# Patient Record
Sex: Female | Born: 1940 | Race: Black or African American | Hispanic: No | Smoking: Never smoker
Health system: Southern US, Community
[De-identification: ages and names within clinical notes are randomized; demographics above are authoritative.]

## PROBLEM LIST (undated history)

## (undated) DIAGNOSIS — I10 Essential (primary) hypertension: Secondary | ICD-10-CM

## (undated) DIAGNOSIS — E7800 Pure hypercholesterolemia, unspecified: Secondary | ICD-10-CM

## (undated) HISTORY — PX: ABDOMINAL HYSTERECTOMY: SHX81

## (undated) HISTORY — PX: OOPHORECTOMY: SHX86

---

## 2004-11-16 ENCOUNTER — Ambulatory Visit: Payer: Self-pay | Admitting: Family Medicine

## 2005-11-19 ENCOUNTER — Ambulatory Visit: Payer: Self-pay | Admitting: Family Medicine

## 2007-05-08 ENCOUNTER — Ambulatory Visit: Payer: Self-pay | Admitting: Family Medicine

## 2008-02-28 ENCOUNTER — Ambulatory Visit: Payer: Self-pay | Admitting: Family Medicine

## 2008-04-20 ENCOUNTER — Ambulatory Visit: Payer: Self-pay | Admitting: Family Medicine

## 2008-05-10 ENCOUNTER — Ambulatory Visit: Payer: Self-pay | Admitting: Family Medicine

## 2009-05-12 ENCOUNTER — Ambulatory Visit: Payer: Self-pay | Admitting: Family Medicine

## 2010-05-25 ENCOUNTER — Ambulatory Visit: Payer: Self-pay | Admitting: Family Medicine

## 2010-10-01 ENCOUNTER — Ambulatory Visit: Payer: Self-pay | Admitting: Internal Medicine

## 2011-06-11 ENCOUNTER — Ambulatory Visit: Payer: Self-pay | Admitting: Family Medicine

## 2012-06-11 ENCOUNTER — Ambulatory Visit: Payer: Self-pay | Admitting: Family Medicine

## 2013-06-18 ENCOUNTER — Ambulatory Visit: Payer: Self-pay | Admitting: Family Medicine

## 2014-06-22 ENCOUNTER — Ambulatory Visit: Payer: Self-pay | Admitting: Family Medicine

## 2014-12-15 DIAGNOSIS — I10 Essential (primary) hypertension: Secondary | ICD-10-CM | POA: Diagnosis not present

## 2014-12-15 DIAGNOSIS — M1990 Unspecified osteoarthritis, unspecified site: Secondary | ICD-10-CM | POA: Diagnosis not present

## 2014-12-15 DIAGNOSIS — E780 Pure hypercholesterolemia: Secondary | ICD-10-CM | POA: Diagnosis not present

## 2014-12-15 DIAGNOSIS — K219 Gastro-esophageal reflux disease without esophagitis: Secondary | ICD-10-CM | POA: Diagnosis not present

## 2015-03-14 ENCOUNTER — Other Ambulatory Visit: Payer: Self-pay

## 2015-03-14 ENCOUNTER — Ambulatory Visit
Admission: EM | Admit: 2015-03-14 | Discharge: 2015-03-14 | Disposition: A | Discharge status: Home / Self Care | Payer: Commercial Managed Care - HMO | Attending: Family Medicine | Admitting: Family Medicine

## 2015-03-14 DIAGNOSIS — I10 Essential (primary) hypertension: Secondary | ICD-10-CM | POA: Diagnosis not present

## 2015-03-14 DIAGNOSIS — G459 Transient cerebral ischemic attack, unspecified: Secondary | ICD-10-CM | POA: Diagnosis not present

## 2015-03-14 DIAGNOSIS — Z823 Family history of stroke: Secondary | ICD-10-CM | POA: Diagnosis not present

## 2015-03-14 DIAGNOSIS — R42 Dizziness and giddiness: Secondary | ICD-10-CM | POA: Diagnosis not present

## 2015-03-14 DIAGNOSIS — E780 Pure hypercholesterolemia: Secondary | ICD-10-CM | POA: Diagnosis not present

## 2015-03-14 DIAGNOSIS — Z7982 Long term (current) use of aspirin: Secondary | ICD-10-CM | POA: Insufficient documentation

## 2015-03-14 DIAGNOSIS — I639 Cerebral infarction, unspecified: Secondary | ICD-10-CM | POA: Diagnosis not present

## 2015-03-14 DIAGNOSIS — Z79899 Other long term (current) drug therapy: Secondary | ICD-10-CM | POA: Insufficient documentation

## 2015-03-14 DIAGNOSIS — R202 Paresthesia of skin: Secondary | ICD-10-CM | POA: Diagnosis not present

## 2015-03-14 DIAGNOSIS — E785 Hyperlipidemia, unspecified: Secondary | ICD-10-CM | POA: Diagnosis not present

## 2015-03-14 DIAGNOSIS — Z5181 Encounter for therapeutic drug level monitoring: Secondary | ICD-10-CM | POA: Diagnosis not present

## 2015-03-14 DIAGNOSIS — K219 Gastro-esophageal reflux disease without esophagitis: Secondary | ICD-10-CM | POA: Diagnosis not present

## 2015-03-14 DIAGNOSIS — R200 Anesthesia of skin: Secondary | ICD-10-CM | POA: Diagnosis not present

## 2015-03-14 HISTORY — DX: Essential (primary) hypertension: I10

## 2015-03-14 HISTORY — DX: Pure hypercholesterolemia, unspecified: E78.00

## 2015-03-14 NOTE — Discharge Instructions (Signed)
As discussed, go directly to the [facility] as you have chosen. This is very important.   Return to Urgent care for new or worsening concerns.   Paresthesia Paresthesia is an abnormal burning or prickling sensation. This sensation is generally felt in the hands, arms, legs, or feet. However, it may occur in any part of the body. It is usually not painful. The feeling may be described as:  Tingling or numbness.  "Pins and needles."  Skin crawling.  Buzzing.  Limbs "falling asleep."  Itching. Most people experience temporary (transient) paresthesia at some time in their lives. CAUSES  Paresthesia may occur when you breathe too quickly (hyperventilation). It can also occur without any apparent cause. Commonly, paresthesia occurs when pressure is placed on a nerve. The feeling quickly goes away once the pressure is removed. For some people, however, paresthesia is a long-lasting (chronic) condition caused by an underlying disorder. The underlying disorder may be:  A traumatic, direct injury to nerves. Examples include a:  Broken (fractured) neck.  Fractured skull.  A disorder affecting the brain and spinal cord (central nervous system). Examples include:  Transverse myelitis.  Encephalitis.  Transient ischemic attack.  Multiple sclerosis.  Stroke.  Tumor or blood vessel problems, such as an arteriovenous malformation pressing against the brain or spinal cord.  A condition that damages the peripheral nerves (peripheral neuropathy). Peripheral nerves are not part of the brain and spinal cord. These conditions include:  Diabetes.  Peripheral vascular disease.  Nerve entrapment syndromes, such as carpal tunnel syndrome.  Shingles.  Hypothyroidism.  Vitamin B12 deficiencies.  Alcoholism.  Heavy metal poisoning (lead, arsenic).  Rheumatoid arthritis.  Systemic lupus erythematosus. DIAGNOSIS  Your caregiver will attempt to find the underlying cause of  your paresthesia. Your caregiver may:  Take your medical history.  Perform a physical exam.  Order various lab tests.  Order imaging tests. TREATMENT  Treatment for paresthesia depends on the underlying cause. HOME CARE INSTRUCTIONS  Avoid drinking alcohol.  You may consider massage or acupuncture to help relieve your symptoms.  Keep all follow-up appointments as directed by your caregiver. SEEK IMMEDIATE MEDICAL CARE IF:   You feel weak.  You have trouble walking or moving.  You have problems with speech or vision.  You feel confused.  You cannot control your bladder or bowel movements.  You feel numbness after an injury.  You faint.  Your burning or prickling feeling gets worse when walking.  You have pain, cramps, or dizziness.  You develop a rash. MAKE SURE YOU:  Understand these instructions.  Will watch your condition.  Will get help right away if you are not doing well or get worse. Document Released: 06/15/2002 Document Revised: 09/17/2011 Document Reviewed: 03/16/2011 [facility] Patient Information 2015 [place], [place]. This information is not intended to replace advice given to you by your health care provider. Make sure you discuss any questions you have with your health care provider.

## 2015-03-14 NOTE — ED Notes (Signed)
States started yesterday with right sided "body tingling". Had right leg tingling 1 week ago. Denies chest pain.. Grips strong and equal. No facial drooping

## 2015-03-14 NOTE — ED Provider Notes (Signed)
[facility] Emergency Department Provider Note  ____________________________________________  Time seen: Approximately 11:25AM  I have reviewed the triage vital signs and the nursing notes.   HISTORY  Chief Complaint Numbness   HPI Ashlyn Cabler is a 74 y.o. female presents to urgent care for complaints of right face and right arm tingling sensation described as numbness sensation. Reports onset yesterday am at approximately 0700, states at that time she had tingling and numbness sensation to right face, right arm and right leg which stayed present for majority of the day but did slightly improve. States currently with right arm and right face tingling sensation. States right arm also feels heavy and weak. States symptoms have been unchanged since awakening this am.   Denies pain. Denies dizziness, headache, vision change or loss, fall, head injury or LOC. Denies chest pain, shortness of breath, abdominal pain, fever, neck or back pain. Denies recent sickness. Denies symptom radiation.   Denies pain at this time. States right arm and face does "not feel normal". Denies history of CVA or TIA. Reports mother had x 2 CVAs. Reports took daily medications last night including 81 mg aspirin, atorvastatin and hctz. Reports drove self to urgent care today.   Denies previous sensation of same or similar. States has arthritis and frequently has leg or shoulder pain but "this is different".     Past Medical History  Diagnosis Date  . Hypercholesteremia   . Hypertension     There are no active problems to display for this patient.   Past Surgical History  Procedure Laterality Date  . Abdominal hysterectomy      Current Outpatient Rx  Name  Route  Sig  Dispense  Refill  . aspirin EC 81 MG tablet   Oral   Take 81 mg by mouth daily.         Marland Kitchen atorvastatin (LIPITOR) 10 MG tablet   Oral   Take 10 mg by mouth daily.         . calcium-vitamin D (OSCAL WITH D)  500-200 MG-UNIT per tablet   Oral   Take 1 tablet by mouth.         . hydrochlorothiazide (HYDRODIURIL) 25 MG tablet   Oral   Take 25 mg by mouth daily.         . potassium chloride (KLOR-CON) 20 MEQ packet   Oral   Take by mouth 2 (two) times daily.           Allergies Review of patient's allergies indicates no known allergies.  Family History  Problem Relation Age of Onset  . Heart attack Mother   . Cancer Mother   Mother CVA x 2   Social History Social History  Substance Use Topics  . Smoking status: Never Smoker   . Smokeless tobacco: None  . Alcohol Use: No    Review of Systems Constitutional: No fever/chills Eyes: No visual changes. ENT: No sore throat. Cardiovascular: Denies chest pain. Respiratory: Denies shortness of breath. Gastrointestinal: No abdominal pain.  No nausea, no vomiting.  No diarrhea.  No constipation. Genitourinary: Negative for dysuria. Musculoskeletal: Negative for back pain. Skin: Negative for rash. Neurological: Negative for headaches.. Right arm tingling and heaviness and right face tingling and numbness  as above.  10-point ROS otherwise negative.  ____________________________________________   PHYSICAL EXAM:  VITAL SIGNS: ED Triage Vitals  Enc Vitals Group     BP 03/14/15 1047 180/82 mmHg     Pulse Rate 03/14/15 1047 88  Resp 03/14/15 1047 17     Temp 03/14/15 1047 97.5 F (36.4 C)     Temp Source 03/14/15 1047 Tympanic     SpO2 03/14/15 1047 98 %     Weight 03/14/15 1047 155 lb (70.308 kg)     Height 03/14/15 1047 5\' 2"  (1.575 m)     Head Cir --      Peak Flow --      Pain Score --      Pain Loc --      Pain Edu? --      Excl. in [place]? --    Today's Vitals   03/14/15 1047 03/14/15 1154  BP: 180/82 184/86  Pulse: 88 92  Temp: 97.5 F (36.4 C)   TempSrc: Tympanic   Resp: 17 17  Height: 5\' 2"  (1.575 m)   Weight: 155 lb (70.308 kg)   SpO2: 98% 99%    Constitutional: Alert and oriented. Well appearing  and in no acute distress. Eyes: Conjunctivae are normal. PERRL. EOMI. No vision loss. No hemianopsia.  Head: Atraumatic.No eccymosis, or swelling. Skin intact.   Ears: no erythema, normal TMs bilaterally.   Nose: No congestion/rhinnorhea.  Mouth/Throat: Mucous membranes are moist.  Oropharynx non-erythematous. Neck: No stridor.  No cervical spine tenderness to palpation. Hematological/Lymphatic/Immunilogical: No cervical lymphadenopathy. Cardiovascular: Normal rate, regular rhythm. Grossly normal heart sounds.  Good peripheral circulation. Respiratory: Normal respiratory effort.  No retractions. Lungs CTAB. Gastrointestinal: Soft and nontender. No distention. Normal Bowel sounds.  No abdominal bruits. No CVA tenderness. Musculoskeletal: No lower or upper extremity tenderness nor edema.  No joint effusions. Bilateral pedal pulses equal and easily palpated. No cervical, thoracic or lumbar TTP.  Neurologic:  Normal speech and language. No gross focal neurologic deficits are appreciated. No gait instability.CN 2-12 intact. No ataxia with finger to nose. Steady gait. Normal discrimination between sharp and dull bilaterally to face and bilateral upper and lower extremities. 5/5 strength to bilateral upper and lower extremities. Negative romberg.  Skin:  Skin is warm, dry and intact. No rash noted. Bilateral hand grips equal.  Psychiatric: Mood and affect are normal. Speech and behavior are normal.  ____________________________________________   LABS (all labs ordered are listed, but only abnormal results are displayed)  Labs Reviewed - No data to display ____________________________________________  EKG  Interpreted and reviewed by Dr Gregary Signs, see ekg.   71 bpm.  Dr Alveta Heimlich agrees EKG showing normal sinus rhythm with sinus arrhythmia. No STEMI. ____________________________________________  INITIAL IMPRESSION / ASSESSMENT AND PLAN / ED COURSE  Pertinent labs & imaging results that were  available during my care of the patient were reviewed by me and considered in my medical decision making (see chart for details).  Alert and oriented. Drove self to urgent care for complaint of right face and right arm tingling sensation persistent since 0700 yesterday (Sunday am). Reports present upon awakening yesterday. Reports yesterday also with right leg tingling sensation that has since resolved. No neurological deficits on exam, however with continued subjective report of right face and right arm tingling/numbness with report of right arm heaviness and weakness today.  Steady gait. Denies chest pain, shortness of breath or abdominal pain.   Discussed in detail with Dr Alveta Heimlich at bedside who also reviewed EKG. Patient alert and oriented with decisional capacity. Suspect reported paresthesia second to TIA. Discussed need for further evaluation and possible further monitoring including Ct, MRI and possible neurology evaluation.  Discussed need to proceed to ER as this facility does  not have these means. Patient [facility] to [facility]. EMS paged by RN.   Patient family now at bedside. [facility] now at bedside. [facility] reports unable to go outside the county to transport patient at this time. Patient alert and oriented with decisional capacity and refuses to go by EMS, and reports will be transported by Son at bedside to [facility] ER. Son at bedside, Sherren Mocha, reports will drive patient directly to ER. Patient and family verbalized understanding and agreed to plan.  Discussed risks and benefits of private vehicle transfer including death, and patient and son verbalized understanding, and choose to transfer self to ER. Private vehicle transfer to [facility] ER for right face and right arm paresthesia, concern for TIA. [facility] transfer center ER called and report given.     ____________________________________________   FINAL CLINICAL IMPRESSION(S) / ED DIAGNOSES  Final diagnoses:   Tingling of right upper extremity and right side of face       Marylene Land, NP 03/14/15 1401

## 2015-03-15 DIAGNOSIS — E785 Hyperlipidemia, unspecified: Secondary | ICD-10-CM | POA: Diagnosis not present

## 2015-03-15 DIAGNOSIS — R202 Paresthesia of skin: Secondary | ICD-10-CM | POA: Diagnosis not present

## 2015-03-15 DIAGNOSIS — Z823 Family history of stroke: Secondary | ICD-10-CM | POA: Diagnosis not present

## 2015-03-15 DIAGNOSIS — R42 Dizziness and giddiness: Secondary | ICD-10-CM | POA: Diagnosis not present

## 2015-03-15 DIAGNOSIS — G459 Transient cerebral ischemic attack, unspecified: Secondary | ICD-10-CM | POA: Diagnosis not present

## 2015-03-15 DIAGNOSIS — Z79899 Other long term (current) drug therapy: Secondary | ICD-10-CM | POA: Diagnosis not present

## 2015-03-15 DIAGNOSIS — Z7982 Long term (current) use of aspirin: Secondary | ICD-10-CM | POA: Diagnosis not present

## 2015-03-15 DIAGNOSIS — Z5181 Encounter for therapeutic drug level monitoring: Secondary | ICD-10-CM | POA: Diagnosis not present

## 2015-03-15 DIAGNOSIS — K219 Gastro-esophageal reflux disease without esophagitis: Secondary | ICD-10-CM | POA: Diagnosis not present

## 2015-03-15 DIAGNOSIS — I10 Essential (primary) hypertension: Secondary | ICD-10-CM | POA: Diagnosis not present

## 2015-03-21 DIAGNOSIS — R202 Paresthesia of skin: Secondary | ICD-10-CM | POA: Diagnosis not present

## 2015-04-13 DIAGNOSIS — G459 Transient cerebral ischemic attack, unspecified: Secondary | ICD-10-CM | POA: Diagnosis not present

## 2015-05-03 DIAGNOSIS — H40003 Preglaucoma, unspecified, bilateral: Secondary | ICD-10-CM | POA: Diagnosis not present

## 2015-05-09 DIAGNOSIS — R200 Anesthesia of skin: Secondary | ICD-10-CM | POA: Diagnosis not present

## 2015-05-19 DIAGNOSIS — G40109 Localization-related (focal) (partial) symptomatic epilepsy and epileptic syndromes with simple partial seizures, not intractable, without status epilepticus: Secondary | ICD-10-CM | POA: Diagnosis not present

## 2015-05-23 DIAGNOSIS — R200 Anesthesia of skin: Secondary | ICD-10-CM | POA: Diagnosis not present

## 2015-05-30 DIAGNOSIS — H40003 Preglaucoma, unspecified, bilateral: Secondary | ICD-10-CM | POA: Diagnosis not present

## 2015-06-20 DIAGNOSIS — G5602 Carpal tunnel syndrome, left upper limb: Secondary | ICD-10-CM | POA: Diagnosis not present

## 2015-06-21 DIAGNOSIS — E7800 Pure hypercholesterolemia, unspecified: Secondary | ICD-10-CM | POA: Diagnosis not present

## 2015-06-21 DIAGNOSIS — J329 Chronic sinusitis, unspecified: Secondary | ICD-10-CM | POA: Diagnosis not present

## 2015-06-21 DIAGNOSIS — B9789 Other viral agents as the cause of diseases classified elsewhere: Secondary | ICD-10-CM | POA: Diagnosis not present

## 2015-06-21 DIAGNOSIS — I10 Essential (primary) hypertension: Secondary | ICD-10-CM | POA: Diagnosis not present

## 2015-08-24 DIAGNOSIS — R05 Cough: Secondary | ICD-10-CM | POA: Diagnosis not present

## 2015-12-27 DIAGNOSIS — Z0000 Encounter for general adult medical examination without abnormal findings: Secondary | ICD-10-CM | POA: Diagnosis not present

## 2015-12-27 DIAGNOSIS — Z1231 Encounter for screening mammogram for malignant neoplasm of breast: Secondary | ICD-10-CM | POA: Diagnosis not present

## 2016-01-12 DIAGNOSIS — R200 Anesthesia of skin: Secondary | ICD-10-CM | POA: Diagnosis not present

## 2016-01-26 DIAGNOSIS — R202 Paresthesia of skin: Secondary | ICD-10-CM | POA: Diagnosis not present

## 2016-01-26 DIAGNOSIS — G252 Other specified forms of tremor: Secondary | ICD-10-CM | POA: Diagnosis not present

## 2016-01-26 DIAGNOSIS — R200 Anesthesia of skin: Secondary | ICD-10-CM | POA: Diagnosis not present

## 2016-02-01 ENCOUNTER — Other Ambulatory Visit: Payer: Self-pay | Admitting: Neurology

## 2016-02-01 DIAGNOSIS — R202 Paresthesia of skin: Principal | ICD-10-CM

## 2016-02-01 DIAGNOSIS — R200 Anesthesia of skin: Secondary | ICD-10-CM

## 2016-02-01 DIAGNOSIS — R251 Tremor, unspecified: Secondary | ICD-10-CM

## 2016-02-21 ENCOUNTER — Ambulatory Visit
Admission: RE | Admit: 2016-02-21 | Discharge: 2016-02-21 | Disposition: A | Discharge status: Home / Self Care | Payer: Commercial Managed Care - HMO | Source: Ambulatory Visit | Attending: Neurology | Admitting: Neurology

## 2016-02-21 DIAGNOSIS — M4802 Spinal stenosis, cervical region: Secondary | ICD-10-CM | POA: Insufficient documentation

## 2016-02-21 DIAGNOSIS — R251 Tremor, unspecified: Secondary | ICD-10-CM

## 2016-02-21 DIAGNOSIS — R202 Paresthesia of skin: Principal | ICD-10-CM

## 2016-02-21 DIAGNOSIS — M50221 Other cervical disc displacement at C4-C5 level: Secondary | ICD-10-CM | POA: Diagnosis not present

## 2016-02-21 DIAGNOSIS — G252 Other specified forms of tremor: Secondary | ICD-10-CM | POA: Diagnosis not present

## 2016-02-21 DIAGNOSIS — R200 Anesthesia of skin: Secondary | ICD-10-CM | POA: Diagnosis not present

## 2016-02-23 DIAGNOSIS — R911 Solitary pulmonary nodule: Secondary | ICD-10-CM | POA: Diagnosis not present

## 2016-02-23 DIAGNOSIS — R918 Other nonspecific abnormal finding of lung field: Secondary | ICD-10-CM | POA: Diagnosis not present

## 2016-06-27 DIAGNOSIS — K219 Gastro-esophageal reflux disease without esophagitis: Secondary | ICD-10-CM | POA: Diagnosis not present

## 2016-06-27 DIAGNOSIS — I10 Essential (primary) hypertension: Secondary | ICD-10-CM | POA: Diagnosis not present

## 2016-06-27 DIAGNOSIS — E7800 Pure hypercholesterolemia, unspecified: Secondary | ICD-10-CM | POA: Diagnosis not present

## 2016-06-27 DIAGNOSIS — R911 Solitary pulmonary nodule: Secondary | ICD-10-CM | POA: Diagnosis not present

## 2017-01-10 DIAGNOSIS — Z79899 Other long term (current) drug therapy: Secondary | ICD-10-CM | POA: Diagnosis not present

## 2017-01-10 DIAGNOSIS — M170 Bilateral primary osteoarthritis of knee: Secondary | ICD-10-CM | POA: Diagnosis not present

## 2017-01-10 DIAGNOSIS — E559 Vitamin D deficiency, unspecified: Secondary | ICD-10-CM | POA: Diagnosis not present

## 2017-01-10 DIAGNOSIS — I10 Essential (primary) hypertension: Secondary | ICD-10-CM | POA: Diagnosis not present

## 2017-01-10 DIAGNOSIS — R7302 Impaired glucose tolerance (oral): Secondary | ICD-10-CM | POA: Diagnosis not present

## 2017-01-10 DIAGNOSIS — E7800 Pure hypercholesterolemia, unspecified: Secondary | ICD-10-CM | POA: Diagnosis not present

## 2017-06-05 ENCOUNTER — Encounter: Payer: Self-pay | Admitting: Emergency Medicine

## 2017-06-05 ENCOUNTER — Ambulatory Visit
Admission: EM | Admit: 2017-06-05 | Discharge: 2017-06-05 | Disposition: A | Discharge status: Home / Self Care | Payer: Commercial Managed Care - HMO | Attending: Family Medicine | Admitting: Family Medicine

## 2017-06-05 ENCOUNTER — Other Ambulatory Visit: Payer: Self-pay

## 2017-06-05 DIAGNOSIS — Z79899 Other long term (current) drug therapy: Secondary | ICD-10-CM | POA: Diagnosis not present

## 2017-06-05 DIAGNOSIS — Z7982 Long term (current) use of aspirin: Secondary | ICD-10-CM | POA: Diagnosis not present

## 2017-06-05 DIAGNOSIS — M940 Chondrocostal junction syndrome [Tietze]: Secondary | ICD-10-CM | POA: Insufficient documentation

## 2017-06-05 DIAGNOSIS — R0789 Other chest pain: Secondary | ICD-10-CM

## 2017-06-05 DIAGNOSIS — R079 Chest pain, unspecified: Secondary | ICD-10-CM | POA: Insufficient documentation

## 2017-06-05 NOTE — ED Provider Notes (Signed)
[facility]    CSN: [id number] Arrival date & time: 06/05/17  1006     History   Chief Complaint Chief Complaint  Patient presents with  . Chest Pain    HPI Tanya Campbell is a 76 y.o. female.   76 yo female with a c/o intermittent chest pain episodes for the past month. States pain is "sharp, like a knife" and lasts only 2-3 seconds. Episodes not associated with any pattern of activity. Denies any neck pain, jaw pain, diaphoresis, fevers, chills, shortness of breath, wheezing.    The history is provided by the patient.  Chest Pain    Past Medical History:  Diagnosis Date  . Hypercholesteremia   . Hypertension     There are no active problems to display for this patient.   Past Surgical History:  Procedure Laterality Date  . ABDOMINAL HYSTERECTOMY      OB History    No data available       Home Medications    Prior to Admission medications   Medication Sig Start Date End Date Taking? Authorizing Provider  aspirin EC 81 MG tablet Take 81 mg by mouth daily.   Yes [provider]  atorvastatin (LIPITOR) 10 MG tablet Take 10 mg by mouth daily.   Yes [provider]  calcium-vitamin D (OSCAL WITH D) 500-200 MG-UNIT per tablet Take 1 tablet by mouth.   Yes [provider]  hydrochlorothiazide (HYDRODIURIL) 25 MG tablet Take 25 mg by mouth daily.   Yes [provider]  potassium chloride (KLOR-CON) 20 MEQ packet Take by mouth 2 (two) times daily.   Yes [provider]    Family History Family History  Problem Relation Age of Onset  . Heart attack Mother   . Cancer Mother     Social History Social History   Tobacco Use  . Smoking status: Never Smoker  . Smokeless tobacco: Never Used  Substance Use Topics  . Alcohol use: No  . Drug use: Not on file     Allergies   Patient has no known allergies.   Review of Systems Review of Systems  Cardiovascular: Positive for chest pain.      Physical Exam Triage Vital Signs ED Triage Vitals  Enc Vitals Group     BP 06/05/17 1014 (!) 153/92     Pulse Rate 06/05/17 1014 93     Resp 06/05/17 1014 14     Temp 06/05/17 1014 98.6 F (37 C)     Temp Source 06/05/17 1014 Oral     SpO2 06/05/17 1014 100 %     Weight 06/05/17 1012 150 lb (68 kg)     Height 06/05/17 1012 5\' 2"  (1.575 m)     Head Circumference --      Peak Flow --      Pain Score 06/05/17 1013 0     Pain Loc --      Pain Edu? --      Excl. in [place]? --    No data found.  Updated Vital Signs BP (!) 153/92 (BP Location: Left Arm)   Pulse 93   Temp 98.6 F (37 C) (Oral)   Resp 14   Ht 5\' 2"  (1.575 m)   Wt 150 lb (68 kg)   SpO2 100%   BMI 27.44 kg/m   Visual Acuity Right Eye Distance:   Left Eye Distance:   Bilateral Distance:    Right Eye Near:   Left Eye Near:  Bilateral Near:     Physical Exam  Constitutional: She appears well-developed and well-nourished. No distress.  HENT:  Head: Normocephalic and atraumatic.  Nose: No mucosal edema, rhinorrhea, nose lacerations, sinus tenderness, nasal deformity, septal deviation or nasal septal hematoma. No epistaxis.  No foreign bodies. Right sinus exhibits no maxillary sinus tenderness and no frontal sinus tenderness. Left sinus exhibits no maxillary sinus tenderness and no frontal sinus tenderness.  Mouth/Throat: Uvula is midline, oropharynx is clear and moist and mucous membranes are normal. No oropharyngeal exudate.  Eyes: Conjunctivae and EOM are normal. Pupils are equal, round, and reactive to light. Right eye exhibits no discharge. Left eye exhibits no discharge. No scleral icterus.  Neck: Normal range of motion. Neck supple. No thyromegaly present.  Cardiovascular: Normal rate, regular rhythm and normal heart sounds.  Pulmonary/Chest: Effort normal and breath sounds normal. No stridor. No respiratory distress. She has no wheezes. She has no rales. She exhibits tenderness.  Musculoskeletal: She  exhibits no edema.  Lymphadenopathy:    She has no cervical adenopathy.  Skin: No rash noted. She is not diaphoretic.  Nursing note and vitals reviewed.    UC Treatments / Results  Labs (all labs ordered are listed, but only abnormal results are displayed) Labs Reviewed - No data to display  EKG  EKG Interpretation None       Radiology No results found.  Procedures ED EKG Date/Time: 06/05/2017 8:00 PM Performed by: Norval Gable, MD Authorized by: Norval Gable, MD   ECG reviewed by ED Physician in the absence of a cardiologist: yes   Previous ECG:    Previous ECG:  Unavailable Interpretation:    Interpretation: normal   Rate:    ECG rate assessment: normal   Rhythm:    Rhythm: sinus rhythm   Ectopy:    Ectopy: none   QRS:    QRS axis:  Normal Conduction:    Conduction: normal   ST segments:    ST segments:  Normal T waves:    T waves: normal      (including critical care time)  Medications Ordered in UC Medications - No data to display   Initial Impression / Assessment and Plan / UC Course  I have reviewed the triage vital signs and the nursing notes.  Pertinent labs & imaging results that were available during my care of the patient were reviewed by me and considered in my medical decision making (see chart for details).       Final Clinical Impressions(s) / UC Diagnoses   Final diagnoses:  Chest wall pain  Costochondritis    ED Discharge Orders    None      1. ekg results and diagnosis reviewed with patient 2. rx as per orders above; reviewed possible side effects, interactions, risks and benefits  3. Recommend supportive treatment with  4. Follow-up prn if symptoms worsen or don't improve    Controlled Substance Prescriptions [place] Controlled Substance Registry consulted? Not Applicable   Norval Gable, MD 06/05/17 2004

## 2017-06-05 NOTE — ED Triage Notes (Signed)
Patient c/o chest pain off and on for a month. Patient denies SOB.

## 2017-07-16 ENCOUNTER — Other Ambulatory Visit: Payer: Self-pay | Admitting: Internal Medicine

## 2017-07-16 DIAGNOSIS — Z1231 Encounter for screening mammogram for malignant neoplasm of breast: Secondary | ICD-10-CM | POA: Diagnosis not present

## 2017-07-16 DIAGNOSIS — R7302 Impaired glucose tolerance (oral): Secondary | ICD-10-CM | POA: Diagnosis not present

## 2017-07-16 DIAGNOSIS — K219 Gastro-esophageal reflux disease without esophagitis: Secondary | ICD-10-CM | POA: Diagnosis not present

## 2017-07-16 DIAGNOSIS — I10 Essential (primary) hypertension: Secondary | ICD-10-CM | POA: Diagnosis not present

## 2017-07-16 DIAGNOSIS — Z1239 Encounter for other screening for malignant neoplasm of breast: Secondary | ICD-10-CM

## 2017-07-16 DIAGNOSIS — E7800 Pure hypercholesterolemia, unspecified: Secondary | ICD-10-CM | POA: Diagnosis not present

## 2017-07-16 DIAGNOSIS — Z79899 Other long term (current) drug therapy: Secondary | ICD-10-CM | POA: Diagnosis not present

## 2017-07-16 DIAGNOSIS — Z0000 Encounter for general adult medical examination without abnormal findings: Secondary | ICD-10-CM | POA: Diagnosis not present

## 2017-07-30 ENCOUNTER — Ambulatory Visit
Admission: RE | Admit: 2017-07-30 | Discharge: 2017-07-30 | Disposition: A | Discharge status: Home / Self Care | Payer: Medicare HMO | Source: Ambulatory Visit | Attending: Internal Medicine | Admitting: Internal Medicine

## 2017-07-30 DIAGNOSIS — Z1231 Encounter for screening mammogram for malignant neoplasm of breast: Secondary | ICD-10-CM | POA: Diagnosis not present

## 2017-07-30 DIAGNOSIS — Z1239 Encounter for other screening for malignant neoplasm of breast: Secondary | ICD-10-CM

## 2017-07-30 DIAGNOSIS — R928 Other abnormal and inconclusive findings on diagnostic imaging of breast: Secondary | ICD-10-CM | POA: Diagnosis not present

## 2017-08-02 ENCOUNTER — Other Ambulatory Visit: Payer: Self-pay | Admitting: Internal Medicine

## 2017-08-02 DIAGNOSIS — R928 Other abnormal and inconclusive findings on diagnostic imaging of breast: Secondary | ICD-10-CM

## 2017-08-02 DIAGNOSIS — R921 Mammographic calcification found on diagnostic imaging of breast: Secondary | ICD-10-CM

## 2017-08-08 ENCOUNTER — Other Ambulatory Visit: Payer: Medicare HMO

## 2017-08-08 ENCOUNTER — Ambulatory Visit: Payer: Medicare HMO

## 2017-08-14 ENCOUNTER — Ambulatory Visit
Admission: RE | Admit: 2017-08-14 | Discharge: 2017-08-14 | Disposition: A | Discharge status: Home / Self Care | Payer: Medicare HMO | Source: Ambulatory Visit | Attending: Internal Medicine | Admitting: Internal Medicine

## 2017-08-14 DIAGNOSIS — R928 Other abnormal and inconclusive findings on diagnostic imaging of breast: Secondary | ICD-10-CM

## 2017-08-14 DIAGNOSIS — R921 Mammographic calcification found on diagnostic imaging of breast: Secondary | ICD-10-CM

## 2017-08-20 ENCOUNTER — Other Ambulatory Visit: Payer: Self-pay | Admitting: Internal Medicine

## 2017-08-20 DIAGNOSIS — R921 Mammographic calcification found on diagnostic imaging of breast: Secondary | ICD-10-CM

## 2017-08-20 DIAGNOSIS — R928 Other abnormal and inconclusive findings on diagnostic imaging of breast: Secondary | ICD-10-CM

## 2017-08-27 ENCOUNTER — Ambulatory Visit
Admission: RE | Admit: 2017-08-27 | Discharge: 2017-08-27 | Disposition: A | Discharge status: Home / Self Care | Payer: Medicare HMO | Source: Ambulatory Visit | Attending: Internal Medicine | Admitting: Internal Medicine

## 2017-08-27 DIAGNOSIS — D241 Benign neoplasm of right breast: Secondary | ICD-10-CM | POA: Insufficient documentation

## 2017-08-27 DIAGNOSIS — N6011 Diffuse cystic mastopathy of right breast: Secondary | ICD-10-CM | POA: Diagnosis not present

## 2017-08-27 DIAGNOSIS — R928 Other abnormal and inconclusive findings on diagnostic imaging of breast: Secondary | ICD-10-CM

## 2017-08-27 DIAGNOSIS — R921 Mammographic calcification found on diagnostic imaging of breast: Secondary | ICD-10-CM

## 2017-08-27 DIAGNOSIS — R920 Mammographic microcalcification found on diagnostic imaging of breast: Secondary | ICD-10-CM | POA: Diagnosis not present

## 2017-08-27 HISTORY — PX: BREAST BIOPSY: SHX20

## 2017-08-28 LAB — SURGICAL PATHOLOGY

## 2018-01-14 DIAGNOSIS — E559 Vitamin D deficiency, unspecified: Secondary | ICD-10-CM | POA: Diagnosis not present

## 2018-01-14 DIAGNOSIS — N183 Chronic kidney disease, stage 3 (moderate): Secondary | ICD-10-CM | POA: Diagnosis not present

## 2018-01-14 DIAGNOSIS — I10 Essential (primary) hypertension: Secondary | ICD-10-CM | POA: Diagnosis not present

## 2018-01-14 DIAGNOSIS — K219 Gastro-esophageal reflux disease without esophagitis: Secondary | ICD-10-CM | POA: Diagnosis not present

## 2018-01-14 DIAGNOSIS — Z79899 Other long term (current) drug therapy: Secondary | ICD-10-CM | POA: Diagnosis not present

## 2018-01-14 DIAGNOSIS — R921 Mammographic calcification found on diagnostic imaging of breast: Secondary | ICD-10-CM | POA: Diagnosis not present

## 2018-01-14 DIAGNOSIS — E7800 Pure hypercholesterolemia, unspecified: Secondary | ICD-10-CM | POA: Diagnosis not present

## 2018-01-14 DIAGNOSIS — R7302 Impaired glucose tolerance (oral): Secondary | ICD-10-CM | POA: Diagnosis not present

## 2018-01-23 ENCOUNTER — Other Ambulatory Visit: Payer: Self-pay | Admitting: Internal Medicine

## 2018-01-23 DIAGNOSIS — R921 Mammographic calcification found on diagnostic imaging of breast: Secondary | ICD-10-CM

## 2018-02-25 ENCOUNTER — Ambulatory Visit
Admission: RE | Admit: 2018-02-25 | Discharge: 2018-02-25 | Disposition: A | Discharge status: Home / Self Care | Payer: Medicare HMO | Source: Ambulatory Visit | Attending: Internal Medicine | Admitting: Internal Medicine

## 2018-02-25 DIAGNOSIS — R921 Mammographic calcification found on diagnostic imaging of breast: Secondary | ICD-10-CM | POA: Insufficient documentation

## 2018-02-25 DIAGNOSIS — R920 Mammographic microcalcification found on diagnostic imaging of breast: Secondary | ICD-10-CM | POA: Diagnosis not present

## 2018-04-18 DIAGNOSIS — Z23 Encounter for immunization: Secondary | ICD-10-CM | POA: Diagnosis not present

## 2018-05-18 ENCOUNTER — Ambulatory Visit
Admission: EM | Admit: 2018-05-18 | Discharge: 2018-05-18 | Disposition: A | Discharge status: Home / Self Care | Payer: Medicare HMO | Attending: Family Medicine | Admitting: Family Medicine

## 2018-05-18 ENCOUNTER — Encounter: Payer: Self-pay | Admitting: Gynecology

## 2018-05-18 ENCOUNTER — Other Ambulatory Visit: Payer: Self-pay

## 2018-05-18 DIAGNOSIS — M79604 Pain in right leg: Secondary | ICD-10-CM | POA: Diagnosis not present

## 2018-05-18 NOTE — ED Triage Notes (Signed)
Per patient x 5 days right leg pain/ discomfort.

## 2018-05-18 NOTE — Discharge Instructions (Signed)
If your symptoms continue or worsen follow-up with your primary care physician

## 2018-05-18 NOTE — ED Provider Notes (Signed)
[facility]    CSN: [id number] Arrival date & time: 05/18/18  9833     History   Chief Complaint Chief Complaint  Patient presents with  . Leg Pain    HPI Tanya Campbell is a 77 y.o. female.   HPI. 77 year old female presents with 5 days history of right leg pain and discomfort that is intermittent and lasts for very short duration of time.  Pain is mainly from her knee down into her ankle level.  Feels as if there was a cramping sensation.  It is not associated with any activity.  He does walk 1 to 2 miles daily and has not injured herself.  She has had no increase in activity change in shoewear.  She has no ankle pain.  He feels tiny knots on her leg at times.  No symptoms of spinal or vascular claudication.  Denies any back pain felt only on the right leg.  Does not affect the left leg at all.  States that she stays well-hydrated.  Prior to this 5 days states that off and on she has had this problem for over a year.  Last group of symptoms were approximately 1 month ago and then subsided.  When she has the pain there does not appear to be any residual the next episode happens.  Last 5 days she has had may be several times a day every other day that the pain is present.         Past Medical History:  Diagnosis Date  . Hypercholesteremia   . Hypertension     There are no active problems to display for this patient.   Past Surgical History:  Procedure Laterality Date  . ABDOMINAL HYSTERECTOMY    . 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 BREAST BIOPSY Right 08/27/2017   FOCAL FIBROCYSTIC CHANGE    OB History   None      Home Medications    Prior to Admission medications   Medication Sig Start Date End Date Taking? Authorizing Provider  aspirin EC 81 MG tablet Take 81 mg by mouth daily.   Yes [provider]  atorvastatin (LIPITOR) 10 MG tablet Take 10 mg by mouth daily.   Yes [provider]  calcium-vitamin D  (OSCAL WITH D) 500-200 MG-UNIT per tablet Take 1 tablet by mouth.   Yes [provider]  hydrochlorothiazide (HYDRODIURIL) 25 MG tablet Take 25 mg by mouth daily.   Yes [provider]  potassium chloride (KLOR-CON) 20 MEQ packet Take by mouth 2 (two) times daily.   Yes [provider]    Family History Family History  Problem Relation Age of Onset  . Heart attack Mother   . Cancer Mother   . Breast cancer Neg Hx     Social History Social History   Tobacco Use  . Smoking status: Never Smoker  . Smokeless tobacco: Never Used  Substance Use Topics  . Alcohol use: No  . Drug use: Not on file     Allergies   Patient has no known allergies.   Review of Systems Review of Systems  Constitutional: Negative for activity change, appetite change, chills, fatigue and fever.  Musculoskeletal: Positive for myalgias.  All other systems reviewed and are negative.    Physical Exam Triage Vital Signs ED Triage Vitals  Enc Vitals Group     BP 05/18/18 0852 133/86     Pulse Rate 05/18/18 0852 80  Resp 05/18/18 0852 16     Temp 05/18/18 0852 97.9 F (36.6 C)     Temp Source 05/18/18 0852 Oral     SpO2 05/18/18 0852 100 %     Weight 05/18/18 0854 140 lb (63.5 kg)     Height 05/18/18 0854 5\' 2"  (1.575 m)     Head Circumference --      Peak Flow --      Pain Score 05/18/18 0854 5     Pain Loc --      Pain Edu? --      Excl. in [place]? --    No data found.  Updated Vital Signs BP 133/86 (BP Location: Left Arm)   Pulse 80   Temp 97.9 F (36.6 C) (Oral)   Resp 16   Ht 5\' 2"  (1.575 m)   Wt 140 lb (63.5 kg)   SpO2 100%   BMI 25.61 kg/m   Visual Acuity Right Eye Distance:   Left Eye Distance:   Bilateral Distance:    Right Eye Near:   Left Eye Near:    Bilateral Near:     Physical Exam  Constitutional: She is oriented to person, place, and time. She appears well-developed and well-nourished. No distress.  HENT:  Head: Normocephalic.    Eyes: Pupils are equal, round, and reactive to light.  Neck: Normal range of motion.  Musculoskeletal: Normal range of motion. She exhibits no edema, tenderness or deformity.  Examination of the right lower extremity shows no edema no ecchymosis no erythema no crepitus.  Puklses of the dorsalis pedis and posterior tibialis are strong full and intact.  There is no hip esthesia to light touch throughout.  EHL peroneal and anterior tibialis are strong to clinical testing.  Full range of motion of her ankle knee and toes.  No swelling or warmth of the right lower extremity.  He is also shows full range of motion.  There is no ligamentous laxity present.  There is no pain about the knee.  Has a totally normal exam of the right lower extremity.  Neurological: She is alert and oriented to person, place, and time.  Skin: Skin is warm and dry. She is not diaphoretic.  Psychiatric: She has a normal mood and affect. Her behavior is normal. Judgment and thought content normal.  Nursing note and vitals reviewed.    UC Treatments / Results  Labs (all labs ordered are listed, but only abnormal results are displayed) Labs Reviewed - No data to display  EKG None  Radiology No results found.  Procedures Procedures (including critical care time)  Medications Ordered in UC Medications - No data to display  Initial Impression / Assessment and Plan / UC Course  I have reviewed the triage vital signs and the nursing notes.  Pertinent labs & imaging results that were available during my care of the patient were reviewed by me and considered in my medical decision making (see chart for details).  Told the patient I cannot find any abnormality on physical exam.  Reassured.  He may be having intermittent muscle spasms without any residuals.  There does not appear to be any evidence of claudication.  Is been a chronic problem from history. Tpld  Her that she needs to make sure of her hydration status.  Do not  have any reason to stop her from her walking.  I recommended that she follow-up with her primary care physician if it continues for a more in-depth work-up if deemed  necessary.     Final Clinical Impressions(s) / UC Diagnoses   Final diagnoses:  Right leg pain     Discharge Instructions     If your symptoms continue or worsen follow-up with your primary care physician   ED Prescriptions    None     Controlled Substance Prescriptions [place] Controlled Substance Registry consulted? Not Applicable   Lorin Picket, PA-C 05/18/18 3702

## 2018-07-15 DIAGNOSIS — R921 Mammographic calcification found on diagnostic imaging of breast: Secondary | ICD-10-CM | POA: Diagnosis not present

## 2018-07-15 DIAGNOSIS — N183 Chronic kidney disease, stage 3 (moderate): Secondary | ICD-10-CM | POA: Diagnosis not present

## 2018-07-15 DIAGNOSIS — E1122 Type 2 diabetes mellitus with diabetic chronic kidney disease: Secondary | ICD-10-CM | POA: Diagnosis not present

## 2018-07-15 DIAGNOSIS — Z79899 Other long term (current) drug therapy: Secondary | ICD-10-CM | POA: Diagnosis not present

## 2018-07-15 DIAGNOSIS — E7800 Pure hypercholesterolemia, unspecified: Secondary | ICD-10-CM | POA: Diagnosis not present

## 2018-07-15 DIAGNOSIS — E559 Vitamin D deficiency, unspecified: Secondary | ICD-10-CM | POA: Diagnosis not present

## 2018-07-15 DIAGNOSIS — E1159 Type 2 diabetes mellitus with other circulatory complications: Secondary | ICD-10-CM | POA: Diagnosis not present

## 2018-07-15 DIAGNOSIS — Z1231 Encounter for screening mammogram for malignant neoplasm of breast: Secondary | ICD-10-CM | POA: Diagnosis not present

## 2018-07-15 DIAGNOSIS — K219 Gastro-esophageal reflux disease without esophagitis: Secondary | ICD-10-CM | POA: Diagnosis not present

## 2018-07-17 ENCOUNTER — Other Ambulatory Visit: Payer: Self-pay | Admitting: Internal Medicine

## 2018-07-17 DIAGNOSIS — R921 Mammographic calcification found on diagnostic imaging of breast: Secondary | ICD-10-CM

## 2018-08-01 ENCOUNTER — Ambulatory Visit
Admission: RE | Admit: 2018-08-01 | Discharge: 2018-08-01 | Disposition: A | Discharge status: Home / Self Care | Payer: Medicare HMO | Source: Ambulatory Visit | Attending: Internal Medicine | Admitting: Internal Medicine

## 2018-08-01 DIAGNOSIS — R921 Mammographic calcification found on diagnostic imaging of breast: Secondary | ICD-10-CM

## 2018-08-19 DIAGNOSIS — E119 Type 2 diabetes mellitus without complications: Secondary | ICD-10-CM | POA: Diagnosis not present

## 2018-10-22 IMAGING — MG MM DIGITAL DIAGNOSTIC UNILAT*R* W/ TOMO W/ CAD
8 of 12 series · 8 of 20 positions shown · non-contrast
Comparison: Previous exam(s).

CLINICAL DATA: Patient presents for a diagnostic right breast exam
to follow-up microcalcifications. Patient had a benign stereotactic
biopsy of 2 groups of microcalcifications just anterior to this
group of microcalcifications which were fibrocystic change and
fibroadenomatoid in nature.

EXAM:
DIGITAL DIAGNOSTIC UNILATERAL RIGHT MAMMOGRAM WITH CAD AND TOMO

[R CC (1 of 3)]
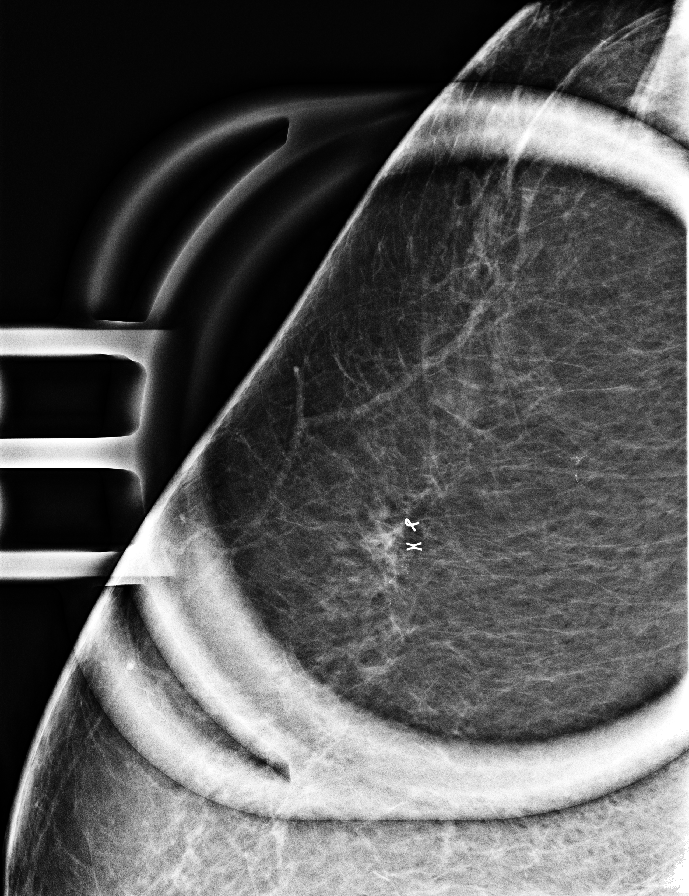

[R LM (1 of 2)]
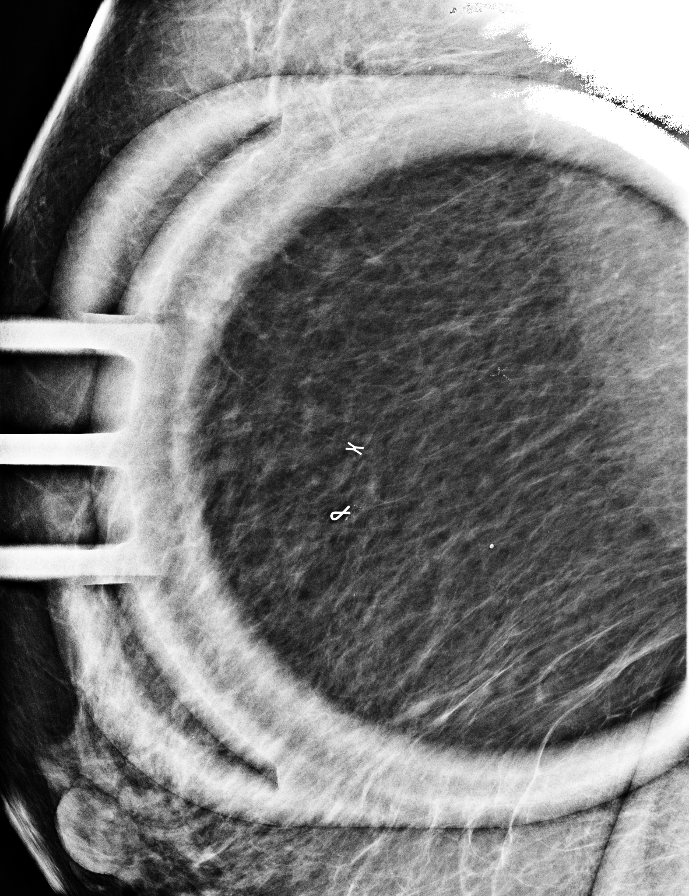

[R MLO synth-2D]
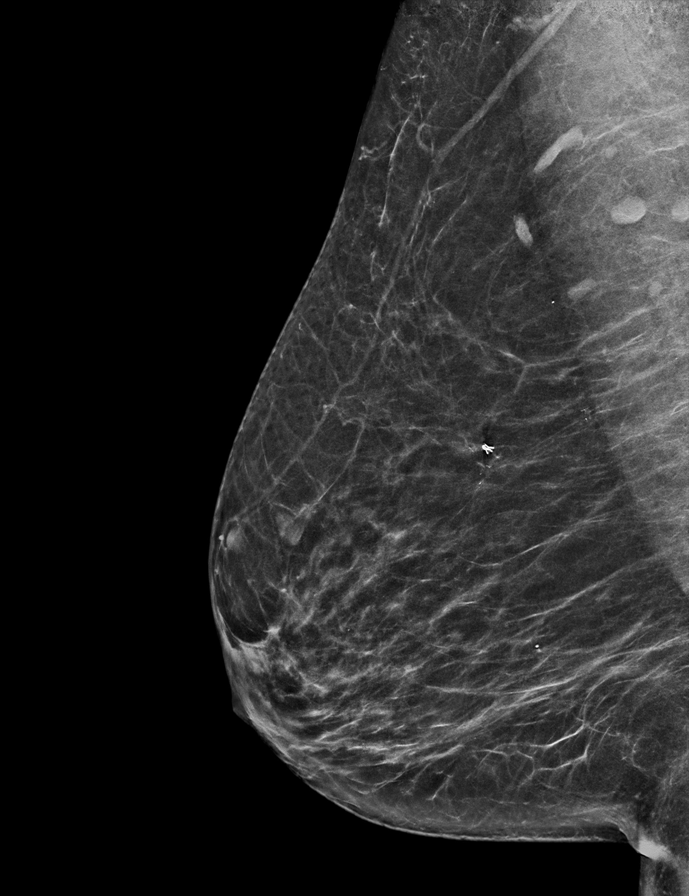

[R CC synth-2D]
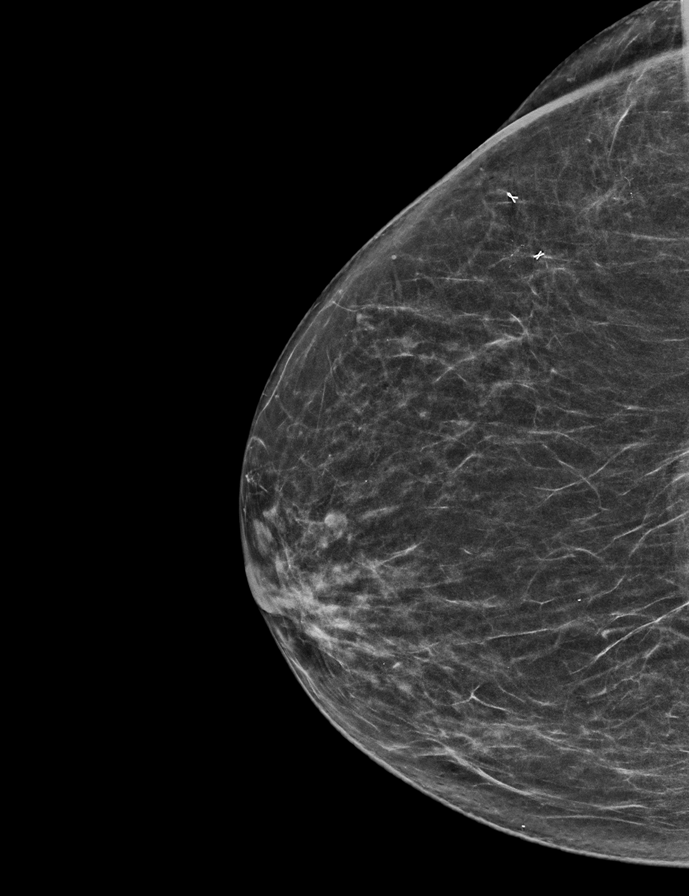

[R MLO]
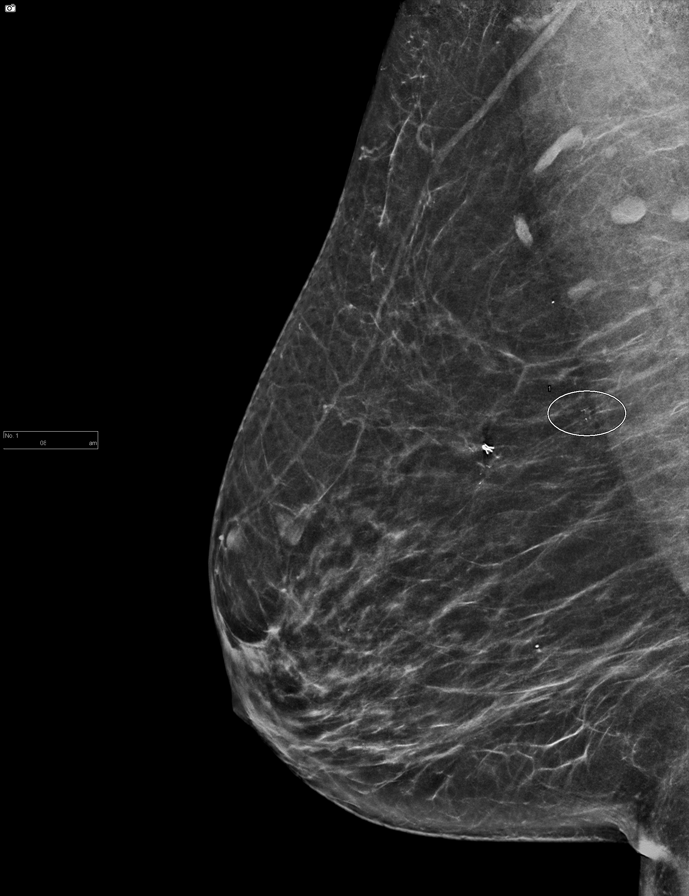

[R LM (2 of 2)]
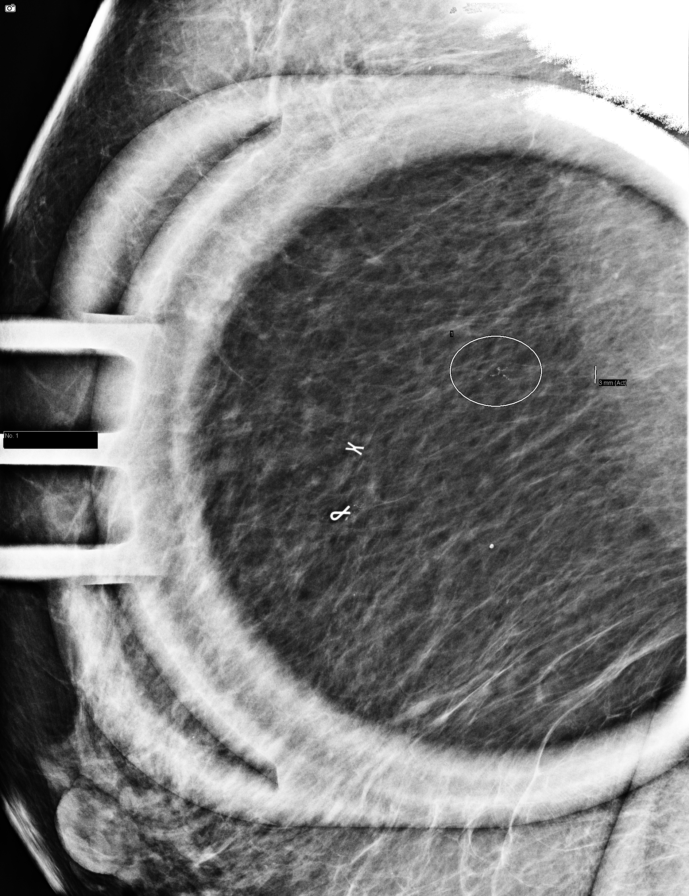

[R CC (2 of 3)]
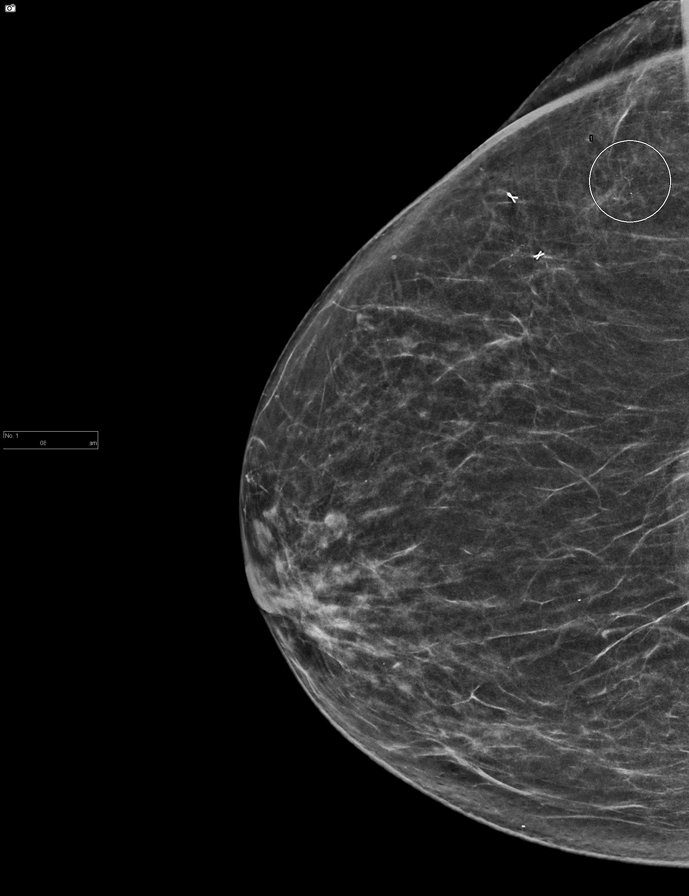

[R CC (3 of 3)]
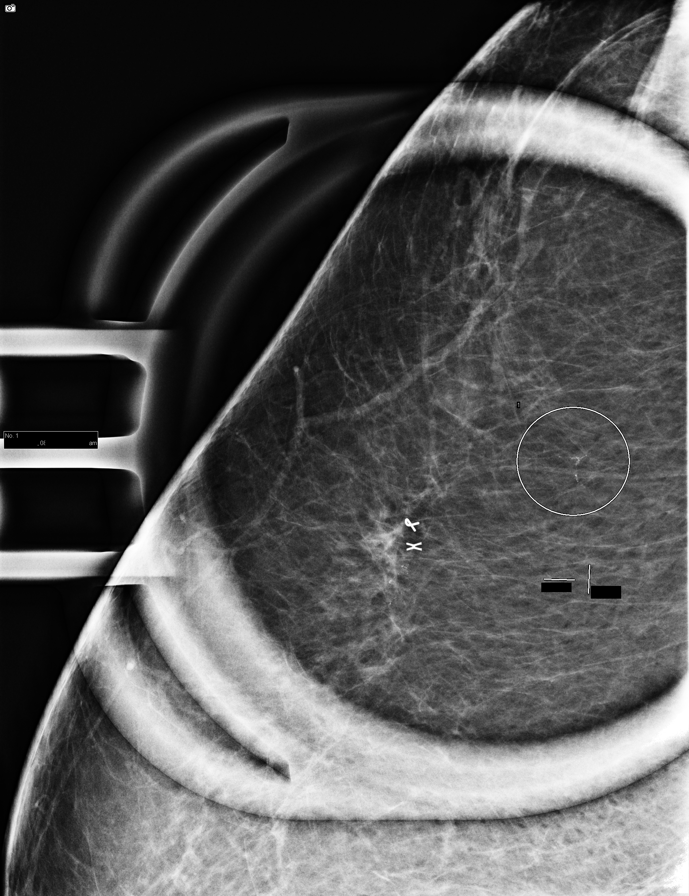

[8 of 20 positions shown; findings below may reference images not displayed]

ACR Breast Density Category b: There are scattered areas of
fibroglandular density.
FINDINGS: Examination demonstrates 2 metallic biopsy clips over the upper
outer right breast in the area patient's previous benign biopsy
sites. There is no change in a group of round microcalcifications
over the posterior third of the upper-outer right breast
approximately 2 cm posterior to the previously biopsied
calcifications. These span 3 x 4 x 4 mm and are similar morphology
to the group of microcalcifications biopsied demonstrating
fibrocystic change. Some of these appear to align along a
small-vessel and could be vascular. Remainder of the exam is
unchanged.

Mammographic images were processed with CAD.
IMPRESSION: Stable probable benign microcalcifications upper-outer right breast.

RECOMMENDATION:
Recommend an additional six-month follow-up diagnostic right breast
mammogram with magnification views at the time of patient's annual
bilateral mammogram in August 2018 to document continued
stability.

I have discussed the findings and recommendations with the patient.
Results were also provided in writing at the conclusion of the
visit. If applicable, a reminder letter will be sent to the patient
regarding the next appointment.

BI-RADS CATEGORY  3: Probably benign.

## 2019-01-13 DIAGNOSIS — E876 Hypokalemia: Secondary | ICD-10-CM | POA: Diagnosis not present

## 2019-01-13 DIAGNOSIS — E1159 Type 2 diabetes mellitus with other circulatory complications: Secondary | ICD-10-CM | POA: Diagnosis not present

## 2019-01-13 DIAGNOSIS — E559 Vitamin D deficiency, unspecified: Secondary | ICD-10-CM | POA: Diagnosis not present

## 2019-01-13 DIAGNOSIS — Z0000 Encounter for general adult medical examination without abnormal findings: Secondary | ICD-10-CM | POA: Diagnosis not present

## 2019-01-13 DIAGNOSIS — E1122 Type 2 diabetes mellitus with diabetic chronic kidney disease: Secondary | ICD-10-CM | POA: Diagnosis not present

## 2019-01-13 DIAGNOSIS — E7800 Pure hypercholesterolemia, unspecified: Secondary | ICD-10-CM | POA: Diagnosis not present

## 2019-01-13 DIAGNOSIS — T502X5A Adverse effect of carbonic-anhydrase inhibitors, benzothiadiazides and other diuretics, initial encounter: Secondary | ICD-10-CM | POA: Diagnosis not present

## 2019-01-13 DIAGNOSIS — D696 Thrombocytopenia, unspecified: Secondary | ICD-10-CM | POA: Diagnosis not present

## 2019-01-13 DIAGNOSIS — Z79899 Other long term (current) drug therapy: Secondary | ICD-10-CM | POA: Diagnosis not present

## 2019-01-13 DIAGNOSIS — N183 Chronic kidney disease, stage 3 (moderate): Secondary | ICD-10-CM | POA: Diagnosis not present

## 2019-01-13 DIAGNOSIS — M170 Bilateral primary osteoarthritis of knee: Secondary | ICD-10-CM | POA: Diagnosis not present

## 2019-01-13 DIAGNOSIS — K219 Gastro-esophageal reflux disease without esophagitis: Secondary | ICD-10-CM | POA: Diagnosis not present

## 2019-03-02 ENCOUNTER — Other Ambulatory Visit: Payer: Self-pay | Admitting: Internal Medicine

## 2019-03-02 DIAGNOSIS — R921 Mammographic calcification found on diagnostic imaging of breast: Secondary | ICD-10-CM

## 2019-03-02 DIAGNOSIS — Z1231 Encounter for screening mammogram for malignant neoplasm of breast: Secondary | ICD-10-CM

## 2019-03-25 ENCOUNTER — Ambulatory Visit
Admission: RE | Admit: 2019-03-25 | Discharge: 2019-03-25 | Disposition: A | Discharge status: Home / Self Care | Payer: Medicare HMO | Source: Ambulatory Visit | Attending: Internal Medicine | Admitting: Internal Medicine

## 2019-03-25 DIAGNOSIS — R921 Mammographic calcification found on diagnostic imaging of breast: Secondary | ICD-10-CM | POA: Diagnosis not present

## 2019-06-28 ENCOUNTER — Other Ambulatory Visit: Payer: Self-pay

## 2019-06-28 ENCOUNTER — Ambulatory Visit
Admission: EM | Admit: 2019-06-28 | Discharge: 2019-06-28 | Disposition: A | Discharge status: Home / Self Care | Payer: Medicare HMO | Attending: Family Medicine | Admitting: Family Medicine

## 2019-06-28 DIAGNOSIS — R61 Generalized hyperhidrosis: Secondary | ICD-10-CM

## 2019-06-28 DIAGNOSIS — R209 Unspecified disturbances of skin sensation: Secondary | ICD-10-CM

## 2019-06-28 DIAGNOSIS — R6889 Other general symptoms and signs: Secondary | ICD-10-CM | POA: Diagnosis not present

## 2019-06-28 DIAGNOSIS — N289 Disorder of kidney and ureter, unspecified: Secondary | ICD-10-CM

## 2019-06-28 LAB — BASIC METABOLIC PANEL
Anion gap: 8 (ref 5–15)
BUN: 26 mg/dL — ABNORMAL HIGH (ref 8–23)
CO2: 27 mmol/L (ref 22–32)
Calcium: 9.4 mg/dL (ref 8.9–10.3)
Chloride: 105 mmol/L (ref 98–111)
Creatinine, Ser: 1.13 mg/dL — ABNORMAL HIGH (ref 0.44–1.00)
GFR calc Af Amer: 53 mL/min — ABNORMAL LOW (ref >60)
GFR calc non Af Amer: 46 mL/min — ABNORMAL LOW (ref >60)
Glucose, Bld: 111 mg/dL — ABNORMAL HIGH (ref 70–99)
Potassium: 4.1 mmol/L (ref 3.5–5.1)
Sodium: 140 mmol/L (ref 135–145)

## 2019-06-28 LAB — CBC WITH DIFFERENTIAL/PLATELET
Abs Immature Granulocytes: 0.02 10*3/uL (ref 0.00–0.07)
Basophils Absolute: 0.1 10*3/uL (ref 0.0–0.1)
Basophils Relative: 1 %
Eosinophils Absolute: 0.1 10*3/uL (ref 0.0–0.5)
Eosinophils Relative: 1 %
HCT: 42.5 % (ref 36.0–46.0)
Hemoglobin: 13.5 g/dL (ref 12.0–15.0)
Immature Granulocytes: 0 %
Lymphocytes Relative: 33 %
Lymphs Abs: 1.6 10*3/uL (ref 0.7–4.0)
MCH: 26.8 pg (ref 26.0–34.0)
MCHC: 31.8 g/dL (ref 30.0–36.0)
MCV: 84.3 fL (ref 80.0–100.0)
Monocytes Absolute: 0.5 10*3/uL (ref 0.1–1.0)
Monocytes Relative: 10 %
Neutro Abs: 2.6 10*3/uL (ref 1.7–7.7)
Neutrophils Relative %: 55 %
Platelets: 151 10*3/uL (ref 150–400)
RBC: 5.04 MIL/uL (ref 3.87–5.11)
RDW: 14.6 % (ref 11.5–15.5)
WBC: 4.8 10*3/uL (ref 4.0–10.5)
nRBC: 0 % (ref 0.0–0.2)

## 2019-06-28 NOTE — ED Triage Notes (Signed)
As per patient had temporal pain onset week ago which got resolved now, feels cold around both feet but sweating on upper extremities denies cough ,fever, SOB onset month

## 2019-06-28 NOTE — ED Provider Notes (Signed)
[facility]    CSN: [id number] Arrival date & time: 06/28/19  1301      History   Chief Complaint Chief Complaint  Patient presents with  . Chills    HPI Tanya Campbell is a 78 y.o. female.   78 yo female with a c/o cold lower extremities for the past one month and intermittent sweats. Denies any fevers, chills, pain, skin discoloration, swelling, shortness of breath.      Past Medical History:  Diagnosis Date  . Hypercholesteremia   . Hypertension     There are no problems to display for this patient.   Past Surgical History:  Procedure Laterality Date  . ABDOMINAL HYSTERECTOMY    . 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 BREAST BIOPSY Right 08/27/2017   FOCAL FIBROCYSTIC CHANGE  . OOPHORECTOMY      OB History   No obstetric history on file.      Home Medications    Prior to Admission medications   Medication Sig Start Date End Date Taking? Authorizing Provider  aspirin EC 81 MG tablet Take 81 mg by mouth daily.   Yes [provider]  atorvastatin (LIPITOR) 10 MG tablet Take 10 mg by mouth daily.   Yes [provider]  calcium-vitamin D (OSCAL WITH D) 500-200 MG-UNIT per tablet Take 1 tablet by mouth.   Yes [provider]  hydrochlorothiazide (HYDRODIURIL) 25 MG tablet Take 25 mg by mouth daily.   Yes [provider]  potassium chloride (KLOR-CON) 20 MEQ packet Take by mouth 2 (two) times daily.   Yes [provider]    Family History Family History  Problem Relation Age of Onset  . Heart attack Mother   . Cancer Mother   . Breast cancer Neg Hx     Social History Social History   Tobacco Use  . Smoking status: Never Smoker  . Smokeless tobacco: Never Used  Substance Use Topics  . Alcohol use: No  . Drug use: Not on file     Allergies   Patient has no known allergies.   Review of Systems Review of Systems   Physical Exam Triage Vital Signs ED Triage  Vitals  Enc Vitals Group     BP 06/28/19 1324 135/75     Pulse Rate 06/28/19 1324 86     Resp 06/28/19 1324 16     Temp 06/28/19 1324 98.4 F (36.9 C)     Temp Source 06/28/19 1324 Oral     SpO2 06/28/19 1324 98 %     Weight 06/28/19 1322 145 lb (65.8 kg)     Height 06/28/19 1322 5\' 2"  (1.575 m)     Head Circumference --      Peak Flow --      Pain Score 06/28/19 1322 0     Pain Loc --      Pain Edu? --      Excl. in [place]? --    No data found.  Updated Vital Signs BP 135/75 (BP Location: Left Arm)   Pulse 86   Temp 98.4 F (36.9 C) (Oral)   Resp 16   Ht 5\' 2"  (1.575 m)   Wt 65.8 kg   SpO2 98%   BMI 26.52 kg/m   Visual Acuity Right Eye Distance:   Left Eye Distance:   Bilateral Distance:    Right Eye Near:   Left Eye Near:    Bilateral Near:     Physical  Exam Vitals and nursing note reviewed.  Constitutional:      General: She is not in acute distress.    Appearance: She is not toxic-appearing or diaphoretic.  Musculoskeletal:     Right lower leg: No swelling, deformity, lacerations, tenderness or bony tenderness.     Left lower leg: No swelling, deformity, lacerations, tenderness or bony tenderness. No edema.     Comments: Cool lower extremities bilaterally; no skin discoloration; normal 2+ pulses; neurovascularly intact  Neurological:     Mental Status: She is alert.      UC Treatments / Results  Labs (all labs ordered are listed, but only abnormal results are displayed) Labs Reviewed  BASIC METABOLIC PANEL - Abnormal; Notable for the following components:      Result Value   Glucose, Bld 111 (*)    BUN 26 (*)    Creatinine, Ser 1.13 (*)    GFR calc non Af Amer 46 (*)    GFR calc Af Amer 53 (*)    All other components within normal limits  CBC WITH DIFFERENTIAL/PLATELET  TSH    EKG   Radiology No results found.  Procedures Procedures (including critical care time)  Medications Ordered in UC Medications - No data to display  Initial  Impression / Assessment and Plan / UC Course  I have reviewed the triage vital signs and the nursing notes.  Pertinent labs & imaging results that were available during my care of the patient were reviewed by me and considered in my medical decision making (see chart for details).      Final Clinical Impressions(s) / UC Diagnoses   Final diagnoses:  Cold extremities  Sweat, sweating, excessive  Mild renal insufficiency     Discharge Instructions     Follow up with Primary Care provider    ED Prescriptions    None     1. Lab results and diagnosis reviewed with patient 2. Recommend supportive treatment with increased water intake 3. TSH ordered 4. Follow up with PCP 4. Follow-up prn PDMP not reviewed this encounter.   Norval Gable, MD 06/28/19 [phone]

## 2019-06-28 NOTE — Discharge Instructions (Signed)
Follow up with Primary Care provider 

## 2019-06-29 LAB — TSH: TSH: 1.761 u[IU]/mL (ref 0.350–4.500)

## 2019-07-21 ENCOUNTER — Other Ambulatory Visit: Payer: Self-pay | Admitting: Internal Medicine

## 2019-07-21 DIAGNOSIS — E1122 Type 2 diabetes mellitus with diabetic chronic kidney disease: Secondary | ICD-10-CM | POA: Diagnosis not present

## 2019-07-21 DIAGNOSIS — I10 Essential (primary) hypertension: Secondary | ICD-10-CM | POA: Diagnosis not present

## 2019-07-21 DIAGNOSIS — D696 Thrombocytopenia, unspecified: Secondary | ICD-10-CM | POA: Diagnosis not present

## 2019-07-21 DIAGNOSIS — R590 Localized enlarged lymph nodes: Secondary | ICD-10-CM

## 2019-07-21 DIAGNOSIS — E7800 Pure hypercholesterolemia, unspecified: Secondary | ICD-10-CM | POA: Diagnosis not present

## 2019-07-21 DIAGNOSIS — E876 Hypokalemia: Secondary | ICD-10-CM | POA: Diagnosis not present

## 2019-07-21 DIAGNOSIS — Z23 Encounter for immunization: Secondary | ICD-10-CM | POA: Diagnosis not present

## 2019-07-21 DIAGNOSIS — R918 Other nonspecific abnormal finding of lung field: Secondary | ICD-10-CM | POA: Diagnosis not present

## 2019-07-21 DIAGNOSIS — L74519 Primary focal hyperhidrosis, unspecified: Secondary | ICD-10-CM | POA: Diagnosis not present

## 2019-07-21 DIAGNOSIS — T502X5A Adverse effect of carbonic-anhydrase inhibitors, benzothiadiazides and other diuretics, initial encounter: Secondary | ICD-10-CM | POA: Diagnosis not present

## 2019-07-21 DIAGNOSIS — R921 Mammographic calcification found on diagnostic imaging of breast: Secondary | ICD-10-CM

## 2019-07-21 DIAGNOSIS — Z79899 Other long term (current) drug therapy: Secondary | ICD-10-CM | POA: Diagnosis not present

## 2019-07-21 DIAGNOSIS — E1159 Type 2 diabetes mellitus with other circulatory complications: Secondary | ICD-10-CM | POA: Diagnosis not present

## 2019-07-21 DIAGNOSIS — E559 Vitamin D deficiency, unspecified: Secondary | ICD-10-CM | POA: Diagnosis not present

## 2019-07-21 DIAGNOSIS — N183 Chronic kidney disease, stage 3 unspecified: Secondary | ICD-10-CM | POA: Diagnosis not present

## 2019-07-24 DIAGNOSIS — R9389 Abnormal findings on diagnostic imaging of other specified body structures: Secondary | ICD-10-CM | POA: Diagnosis not present

## 2019-07-30 DIAGNOSIS — R918 Other nonspecific abnormal finding of lung field: Secondary | ICD-10-CM | POA: Diagnosis not present

## 2019-09-11 ENCOUNTER — Ambulatory Visit
Admission: RE | Admit: 2019-09-11 | Discharge: 2019-09-11 | Disposition: A | Discharge status: Home / Self Care | Payer: Medicare HMO | Source: Ambulatory Visit | Attending: Internal Medicine | Admitting: Internal Medicine

## 2019-09-11 DIAGNOSIS — R920 Mammographic microcalcification found on diagnostic imaging of breast: Secondary | ICD-10-CM | POA: Diagnosis not present

## 2019-09-11 DIAGNOSIS — R921 Mammographic calcification found on diagnostic imaging of breast: Secondary | ICD-10-CM | POA: Diagnosis not present

## 2019-09-11 DIAGNOSIS — R590 Localized enlarged lymph nodes: Secondary | ICD-10-CM

## 2020-01-19 DIAGNOSIS — Z0000 Encounter for general adult medical examination without abnormal findings: Secondary | ICD-10-CM | POA: Diagnosis not present

## 2020-01-19 DIAGNOSIS — E1169 Type 2 diabetes mellitus with other specified complication: Secondary | ICD-10-CM | POA: Diagnosis not present

## 2020-01-19 DIAGNOSIS — E1122 Type 2 diabetes mellitus with diabetic chronic kidney disease: Secondary | ICD-10-CM | POA: Diagnosis not present

## 2020-01-19 DIAGNOSIS — D696 Thrombocytopenia, unspecified: Secondary | ICD-10-CM | POA: Diagnosis not present

## 2020-01-19 DIAGNOSIS — E876 Hypokalemia: Secondary | ICD-10-CM | POA: Diagnosis not present

## 2020-01-19 DIAGNOSIS — E7800 Pure hypercholesterolemia, unspecified: Secondary | ICD-10-CM | POA: Diagnosis not present

## 2020-01-19 DIAGNOSIS — K219 Gastro-esophageal reflux disease without esophagitis: Secondary | ICD-10-CM | POA: Diagnosis not present

## 2020-01-19 DIAGNOSIS — Z780 Asymptomatic menopausal state: Secondary | ICD-10-CM | POA: Diagnosis not present

## 2020-01-19 DIAGNOSIS — N183 Chronic kidney disease, stage 3 unspecified: Secondary | ICD-10-CM | POA: Diagnosis not present

## 2020-01-19 DIAGNOSIS — Z79899 Other long term (current) drug therapy: Secondary | ICD-10-CM | POA: Diagnosis not present

## 2020-01-19 DIAGNOSIS — T502X5A Adverse effect of carbonic-anhydrase inhibitors, benzothiadiazides and other diuretics, initial encounter: Secondary | ICD-10-CM | POA: Diagnosis not present

## 2020-03-01 DIAGNOSIS — H40003 Preglaucoma, unspecified, bilateral: Secondary | ICD-10-CM | POA: Diagnosis not present

## 2020-04-05 DIAGNOSIS — Z780 Asymptomatic menopausal state: Secondary | ICD-10-CM | POA: Diagnosis not present

## 2020-07-26 ENCOUNTER — Other Ambulatory Visit: Payer: Self-pay | Admitting: Internal Medicine

## 2020-07-26 DIAGNOSIS — N183 Chronic kidney disease, stage 3 unspecified: Secondary | ICD-10-CM | POA: Diagnosis not present

## 2020-07-26 DIAGNOSIS — E876 Hypokalemia: Secondary | ICD-10-CM | POA: Diagnosis not present

## 2020-07-26 DIAGNOSIS — Z1239 Encounter for other screening for malignant neoplasm of breast: Secondary | ICD-10-CM | POA: Diagnosis not present

## 2020-07-26 DIAGNOSIS — D696 Thrombocytopenia, unspecified: Secondary | ICD-10-CM | POA: Diagnosis not present

## 2020-07-26 DIAGNOSIS — M170 Bilateral primary osteoarthritis of knee: Secondary | ICD-10-CM | POA: Diagnosis not present

## 2020-07-26 DIAGNOSIS — I129 Hypertensive chronic kidney disease with stage 1 through stage 4 chronic kidney disease, or unspecified chronic kidney disease: Secondary | ICD-10-CM | POA: Diagnosis not present

## 2020-07-26 DIAGNOSIS — T502X5A Adverse effect of carbonic-anhydrase inhibitors, benzothiadiazides and other diuretics, initial encounter: Secondary | ICD-10-CM | POA: Diagnosis not present

## 2020-07-26 DIAGNOSIS — E559 Vitamin D deficiency, unspecified: Secondary | ICD-10-CM | POA: Diagnosis not present

## 2020-07-26 DIAGNOSIS — Z79899 Other long term (current) drug therapy: Secondary | ICD-10-CM | POA: Diagnosis not present

## 2020-07-26 DIAGNOSIS — E7800 Pure hypercholesterolemia, unspecified: Secondary | ICD-10-CM | POA: Diagnosis not present

## 2020-07-26 DIAGNOSIS — E1122 Type 2 diabetes mellitus with diabetic chronic kidney disease: Secondary | ICD-10-CM | POA: Diagnosis not present

## 2020-07-26 DIAGNOSIS — R921 Mammographic calcification found on diagnostic imaging of breast: Secondary | ICD-10-CM

## 2020-07-26 DIAGNOSIS — K219 Gastro-esophageal reflux disease without esophagitis: Secondary | ICD-10-CM | POA: Diagnosis not present

## 2020-07-26 DIAGNOSIS — E1169 Type 2 diabetes mellitus with other specified complication: Secondary | ICD-10-CM | POA: Diagnosis not present

## 2020-08-30 DIAGNOSIS — H40003 Preglaucoma, unspecified, bilateral: Secondary | ICD-10-CM | POA: Diagnosis not present

## 2020-09-06 ENCOUNTER — Other Ambulatory Visit: Payer: Self-pay | Admitting: Internal Medicine

## 2020-09-06 DIAGNOSIS — R921 Mammographic calcification found on diagnostic imaging of breast: Secondary | ICD-10-CM

## 2020-09-19 ENCOUNTER — Other Ambulatory Visit: Payer: Self-pay

## 2020-09-19 ENCOUNTER — Encounter: Payer: Self-pay | Admitting: Emergency Medicine

## 2020-09-19 ENCOUNTER — Ambulatory Visit
Admission: EM | Admit: 2020-09-19 | Discharge: 2020-09-19 | Disposition: A | Discharge status: Home / Self Care | Payer: Medicare HMO | Attending: Sports Medicine | Admitting: Sports Medicine

## 2020-09-19 DIAGNOSIS — R519 Headache, unspecified: Secondary | ICD-10-CM

## 2020-09-19 NOTE — Discharge Instructions (Addendum)
Physical exam is reassuring. I gave the educational handouts.  While you do not have temporal arteritis I included that just so you could read about it. I encourage you to get in and see Dr. Dorthula Perfect if your symptoms persist.  Certainly if they worsen in any way go to the emergency room or call 911 as you may need a higher level of care. You can use anti-inflammatories over-the-counter for any discomfort.  I will be get a feeling better, Dr. Drema Dallas

## 2020-09-19 NOTE — ED Triage Notes (Signed)
Pt c/o pain in the left side of her head in the temple area. She states it last less than a minute. Started about 3 weeks ago. Denies blurred vision, dizziness, weakness.

## 2020-09-24 NOTE — ED Provider Notes (Signed)
[facility]    CSN: [id number] Arrival date & time: 09/19/20  0849      History   Chief Complaint Chief Complaint  Patient presents with  . Headache    HPI Tanya Campbell is a 80 y.o. female.   Patient pleasant 80 year old female who presents for evaluation of the above issue.  Normally sees Dr. Genene Churn for ongoing medical care.  She reports 3 weeks of pain in her left temple.  She denies any acute accidents trauma or falls.  No vision changes or headaches.  There is no issues at the TMJ joint.  She says that when she does get the pain is sharp in nature lasts about 5 seconds and resolves.  Her concern was that awoke her from sleep this morning so she comes in today for urgent care evaluation.  She does have an ophthalmologist and sees Dr. Edison Pace.  She says that when she gets it it usually only once a day.  She is had it now 5 times in the last 3 weeks.  She did see ophthalmology about 3 weeks ago but she says her symptoms happened after that.  No new medications.  She has no rheumatological history.  She also reports no nausea vomiting diarrhea, no fever shakes chills.  No chest pain shortness of breath or numbness or tingling in any extremity.  History does not reveal any concern for cardiac or stroke symptomatology.  She also does not report any significant headaches, history of migraine headaches or aura.  No red flag signs or symptoms elicited on history.     Past Medical History:  Diagnosis Date  . Hypercholesteremia   . Hypertension     There are no problems to display for this patient.   Past Surgical History:  Procedure Laterality Date  . ABDOMINAL HYSTERECTOMY    . 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 BREAST BIOPSY Right 08/27/2017   FOCAL FIBROCYSTIC CHANGE  . OOPHORECTOMY      OB History   No obstetric history on file.      Home Medications    Prior to Admission medications   Medication Sig Start Date End Date  Taking? Authorizing Provider  aspirin EC 81 MG tablet Take 81 mg by mouth daily.   Yes [provider]  atorvastatin (LIPITOR) 10 MG tablet Take 10 mg by mouth daily.   Yes [provider]  calcium-vitamin D (OSCAL WITH D) 500-200 MG-UNIT per tablet Take 1 tablet by mouth.   Yes [provider]  hydrochlorothiazide (HYDRODIURIL) 25 MG tablet Take 25 mg by mouth daily.   Yes [provider]  potassium chloride (KLOR-CON) 20 MEQ packet Take by mouth 2 (two) times daily.   Yes [provider]    Family History Family History  Problem Relation Age of Onset  . Heart attack Mother   . Cancer Mother   . Breast cancer Neg Hx     Social History Social History   Tobacco Use  . Smoking status: Never Smoker  . Smokeless tobacco: Never Used  Vaping Use  . Vaping Use: Never used  Substance Use Topics  . Alcohol use: No  . Drug use: Not Currently     Allergies   Patient has no known allergies.   Review of Systems Review of Systems  Constitutional: Negative.  Negative for activity change, appetite change, chills, diaphoresis, fatigue and fever.  HENT: Negative.  Negative for congestion, dental problem, ear pain,  sinus pressure and sinus pain.        No TMJ pain  Eyes: Negative.  Negative for photophobia, pain, discharge, redness, itching and visual disturbance.  Respiratory: Negative.  Negative for cough, shortness of breath, wheezing and stridor.   Cardiovascular: Negative.  Negative for chest pain and palpitations.  Gastrointestinal: Negative.   Genitourinary: Negative.   Musculoskeletal: Negative.  Negative for neck pain and neck stiffness.  Skin: Negative.  Negative for color change and rash.  Neurological: Positive for headaches. Negative for dizziness, tremors, seizures, syncope, facial asymmetry, speech difficulty, weakness, light-headedness and numbness.  All other systems reviewed and are negative.    Physical Exam Triage Vital  Signs ED Triage Vitals  Enc Vitals Group     BP 09/19/20 0922 (!) 153/84     Pulse Rate 09/19/20 0922 85     Resp 09/19/20 0922 18     Temp 09/19/20 0922 98.1 F (36.7 C)     Temp Source 09/19/20 0922 Oral     SpO2 09/19/20 0922 98 %     Weight 09/19/20 0920 145 lb 1 oz (65.8 kg)     Height 09/19/20 0920 5\' 2"  (1.575 m)     Head Circumference --      Peak Flow --      Pain Score 09/19/20 0919 0     Pain Loc --      Pain Edu? --      Excl. in [place]? --    No data found.  Updated Vital Signs BP (!) 153/84 (BP Location: Right Arm)   Pulse 85   Temp 98.1 F (36.7 C) (Oral)   Resp 18   Ht 5\' 2"  (1.575 m)   Wt 65.8 kg   SpO2 98%   BMI 26.53 kg/m   Visual Acuity Right Eye Distance:   Left Eye Distance:   Bilateral Distance:    Right Eye Near:   Left Eye Near:    Bilateral Near:     Physical Exam Vitals and nursing note reviewed.  Constitutional:      General: She is not in acute distress.    Appearance: She is well-developed. She is not ill-appearing, toxic-appearing or diaphoretic.  HENT:     Head: Normocephalic and atraumatic.     Jaw: No trismus, tenderness, swelling, pain on movement or malocclusion.     Salivary Glands: Right salivary gland is not tender. Left salivary gland is not tender.     Comments: TTP left temple    Right Ear: Tympanic membrane normal.     Left Ear: Tympanic membrane normal.     Nose: Nose normal.     Mouth/Throat:     Mouth: Mucous membranes are moist.     Pharynx: Oropharynx is clear.  Eyes:     General: No visual field deficit or scleral icterus.    Extraocular Movements: Extraocular movements intact.     Right eye: Normal extraocular motion and no nystagmus.     Left eye: Normal extraocular motion and no nystagmus.     Pupils: Pupils are equal, round, and reactive to light. Pupils are equal.     Right eye: Pupil is round and reactive.     Left eye: Pupil is round and reactive.  Cardiovascular:     Rate and Rhythm: Normal rate  and regular rhythm.     Heart sounds: Normal heart sounds. No murmur heard. No friction rub. No gallop.   Pulmonary:     Effort: Pulmonary effort  is normal. No respiratory distress.     Breath sounds: Normal breath sounds. No stridor. No wheezing, rhonchi or rales.  Musculoskeletal:        General: No swelling or tenderness.     Cervical back: Normal range of motion and neck supple. No rigidity or tenderness.  Skin:    General: Skin is warm and dry.     Capillary Refill: Capillary refill takes less than 2 seconds.     Findings: No erythema or rash.  Neurological:     Mental Status: She is alert and oriented to person, place, and time.     GCS: GCS eye subscore is 4. GCS verbal subscore is 5. GCS motor subscore is 6.     Cranial Nerves: No cranial nerve deficit, dysarthria or facial asymmetry.     Sensory: No sensory deficit.     Motor: No weakness.     Coordination: Coordination normal.     Gait: Gait normal.     Deep Tendon Reflexes: Reflexes normal.  Psychiatric:        Mood and Affect: Mood normal.        Speech: Speech normal.        Behavior: Behavior normal.      UC Treatments / Results  Labs (all labs ordered are listed, but only abnormal results are displayed) Labs Reviewed - No data to display  EKG   Radiology No results found.  Procedures Procedures (including critical care time)  Medications Ordered in UC Medications - No data to display  Initial Impression / Assessment and Plan / UC Course  I have reviewed the triage vital signs and the nursing notes.  Pertinent labs & imaging results that were available during my care of the patient were reviewed by me and considered in my medical decision making (see chart for details).  Clinical impression: 3 weeks of pain in the left temple.  She has had 5 episodes in the last 3 weeks the last about 5 seconds in nature.  Is a sharp pain.  No other symptoms.  Because awoke her from sleep she comes in today for  additional evaluation.  Treatment plan: 1.  The findings and treatment plan were discussed in detail with the patient.  Patient was in agreement. 2.  I did have a long discussion with the patient regarding her findings and that her exam was very normal.  Her vitals are also normal except for some mild elevation in her blood pressure.  She reports she did not take her blood pressure medicine this morning. 3.  Have encouraged her to get in with her primary care physician if this persists.  Alternatively if they worsen in any way then she needs to call 911 or go to the emergency room. 4.  Over-the-counter meds such as anti-inflammatories as needed. 5.  While she is not presenting as temporal arteritis, I did give her an educational handout on this condition in case she wanted to read about to see if any of her symptoms were consistent with this. 6.  Follow-up here as needed.    Final Clinical Impressions(s) / UC Diagnoses   Final diagnoses:  Left facial pain     Discharge Instructions     Physical exam is reassuring. I gave the educational handouts.  While you do not have temporal arteritis I included that just so you could read about it. I encourage you to get in and see Dr. Dorthula Perfect if your symptoms persist.  Certainly if they  worsen in any way go to the emergency room or call 911 as you may need a higher level of care. You can use anti-inflammatories over-the-counter for any discomfort.  I will be get a feeling better, Dr. Drema Dallas   ED Prescriptions    None     PDMP not reviewed this encounter.   Verda Cumins, MD 09/24/20 2027

## 2020-10-04 ENCOUNTER — Other Ambulatory Visit: Payer: Self-pay

## 2020-10-04 ENCOUNTER — Ambulatory Visit
Admission: RE | Admit: 2020-10-04 | Discharge: 2020-10-04 | Disposition: A | Discharge status: Home / Self Care | Payer: Medicare HMO | Source: Ambulatory Visit | Attending: Internal Medicine | Admitting: Internal Medicine

## 2020-10-04 DIAGNOSIS — R921 Mammographic calcification found on diagnostic imaging of breast: Secondary | ICD-10-CM | POA: Insufficient documentation

## 2020-10-04 DIAGNOSIS — R922 Inconclusive mammogram: Secondary | ICD-10-CM | POA: Diagnosis not present

## 2021-03-08 DIAGNOSIS — H40003 Preglaucoma, unspecified, bilateral: Secondary | ICD-10-CM | POA: Diagnosis not present

## 2021-03-17 DIAGNOSIS — E119 Type 2 diabetes mellitus without complications: Secondary | ICD-10-CM | POA: Diagnosis not present

## 2021-04-18 DIAGNOSIS — H401131 Primary open-angle glaucoma, bilateral, mild stage: Secondary | ICD-10-CM | POA: Diagnosis not present

## 2021-05-10 DIAGNOSIS — K219 Gastro-esophageal reflux disease without esophagitis: Secondary | ICD-10-CM | POA: Diagnosis not present

## 2021-05-10 DIAGNOSIS — Z79899 Other long term (current) drug therapy: Secondary | ICD-10-CM | POA: Diagnosis not present

## 2021-05-10 DIAGNOSIS — N183 Chronic kidney disease, stage 3 unspecified: Secondary | ICD-10-CM | POA: Diagnosis not present

## 2021-05-10 DIAGNOSIS — I1310 Hypertensive heart and chronic kidney disease without heart failure, with stage 1 through stage 4 chronic kidney disease, or unspecified chronic kidney disease: Secondary | ICD-10-CM | POA: Diagnosis not present

## 2021-05-10 DIAGNOSIS — Z1329 Encounter for screening for other suspected endocrine disorder: Secondary | ICD-10-CM | POA: Diagnosis not present

## 2021-05-10 DIAGNOSIS — Z1321 Encounter for screening for nutritional disorder: Secondary | ICD-10-CM | POA: Diagnosis not present

## 2021-05-10 DIAGNOSIS — E7800 Pure hypercholesterolemia, unspecified: Secondary | ICD-10-CM | POA: Diagnosis not present

## 2021-05-10 DIAGNOSIS — E559 Vitamin D deficiency, unspecified: Secondary | ICD-10-CM | POA: Diagnosis not present

## 2021-05-10 DIAGNOSIS — E1122 Type 2 diabetes mellitus with diabetic chronic kidney disease: Secondary | ICD-10-CM | POA: Diagnosis not present

## 2021-05-10 DIAGNOSIS — E1169 Type 2 diabetes mellitus with other specified complication: Secondary | ICD-10-CM | POA: Diagnosis not present

## 2021-05-10 DIAGNOSIS — M170 Bilateral primary osteoarthritis of knee: Secondary | ICD-10-CM | POA: Diagnosis not present

## 2021-05-10 DIAGNOSIS — H402221 Chronic angle-closure glaucoma, left eye, mild stage: Secondary | ICD-10-CM | POA: Diagnosis not present

## 2021-05-10 DIAGNOSIS — Z0000 Encounter for general adult medical examination without abnormal findings: Secondary | ICD-10-CM | POA: Diagnosis not present

## 2021-05-10 DIAGNOSIS — Z23 Encounter for immunization: Secondary | ICD-10-CM | POA: Diagnosis not present

## 2021-08-30 ENCOUNTER — Other Ambulatory Visit: Payer: Self-pay | Admitting: Gerontology

## 2021-08-30 DIAGNOSIS — Z1231 Encounter for screening mammogram for malignant neoplasm of breast: Secondary | ICD-10-CM

## 2021-10-23 ENCOUNTER — Ambulatory Visit
Admission: RE | Admit: 2021-10-23 | Discharge: 2021-10-23 | Disposition: A | Discharge status: Home / Self Care | Payer: Medicare HMO | Source: Ambulatory Visit | Attending: Gerontology | Admitting: Gerontology

## 2021-10-23 DIAGNOSIS — Z1231 Encounter for screening mammogram for malignant neoplasm of breast: Secondary | ICD-10-CM | POA: Insufficient documentation

## 2022-12-05 ENCOUNTER — Other Ambulatory Visit: Payer: Self-pay

## 2022-12-05 DIAGNOSIS — Z1231 Encounter for screening mammogram for malignant neoplasm of breast: Secondary | ICD-10-CM

## 2022-12-25 ENCOUNTER — Ambulatory Visit
Admission: RE | Admit: 2022-12-25 | Discharge: 2022-12-25 | Disposition: A | Discharge status: Home / Self Care | Payer: Medicare HMO | Source: Ambulatory Visit | Attending: Gerontology | Admitting: Gerontology

## 2022-12-25 DIAGNOSIS — Z1231 Encounter for screening mammogram for malignant neoplasm of breast: Secondary | ICD-10-CM | POA: Insufficient documentation

## 2023-10-26 ENCOUNTER — Encounter: Payer: Self-pay | Admitting: Family Medicine

## 2023-10-26 ENCOUNTER — Ambulatory Visit
Admission: EM | Admit: 2023-10-26 | Discharge: 2023-10-26 | Disposition: A | Discharge status: Home / Self Care | Attending: Family Medicine | Admitting: Family Medicine

## 2023-10-26 DIAGNOSIS — R519 Headache, unspecified: Secondary | ICD-10-CM | POA: Diagnosis not present

## 2023-10-26 DIAGNOSIS — Z8673 Personal history of transient ischemic attack (TIA), and cerebral infarction without residual deficits: Secondary | ICD-10-CM

## 2023-10-26 NOTE — Discharge Instructions (Addendum)
 As discussed, the cause of your facial pain has not been identified. Possibly vascular issue or nerve sensitivity.  I worry if your symptoms return you could eventually have a stroke. Go to the emergency department at the hospital for head imaging and rule out possible stroke, if symptoms return. Otherwise, follow up with your primary care provider.      Go to ED for red flag symptoms, including; severe headaches, vision changes, numbness/weakness in part of the body, lethargy, confusion, intractable vomiting, severe dehydration, chest pain, breathing difficulty,  signs of severe infection (increased redness, swelling of an area), feeling faint or passing out, dizziness, etc. You should especially go to the ED for sudden acute worsening of condition if you do not elect to go at this time.

## 2023-10-26 NOTE — ED Triage Notes (Signed)
 Patient states she woke up this morning with a pain on left side of face. Patient states pain has not traveled anywhere else, she can feel where the pain was. Affecting vision "a little bit."

## 2023-10-26 NOTE — ED Provider Notes (Addendum)
 [facility]    CSN: [id number] Arrival date & time: 10/26/23  1024      History   Chief Complaint No chief complaint on file.   HPI Tanya Campbell is a 83 y.o. female.   HPI  History provided by patient Tanya Campbell presents for sharp left-sided facial pain that occurred this morning after waking up.  The pain did not wake her up.  It only lasted for a few seconds and was like a line down her face.  There is no facial numbness or difficulty moving her face.  She did not feel disoriented, confused, have a headache or have weakness in her extremities.  Denies any blurry vision but feels like it affected her "vision a little bit".  There has been no falls or head trauma.  She feels like herself but she just got worried so she wanted to get checked out.  Describes remote history of possible TIA.  Denies having previous stroke.    Past Medical History:  Diagnosis Date   Hypercholesteremia    Hypertension     There are no active problems to display for this patient.   Past Surgical History:  Procedure Laterality Date   ABDOMINAL HYSTERECTOMY     BREAST BIOPSY Right 08/27/2017   FIBROADENOMATOUS CHANGE    BREAST BIOPSY Right 08/27/2017   FOCAL FIBROCYSTIC CHANGE   OOPHORECTOMY      OB History   No obstetric history on file.      Home Medications    Prior to Admission medications   Medication Sig Start Date End Date Taking? Authorizing Provider  aspirin EC 81 MG tablet Take 81 mg by mouth daily.    [provider]  atorvastatin (LIPITOR) 10 MG tablet Take 10 mg by mouth daily.    [provider]  calcium-vitamin D (OSCAL WITH D) 500-200 MG-UNIT per tablet Take 1 tablet by mouth.    [provider]  Cholecalciferol (VITAMIN D-1000 MAX ST) 25 MCG (1000 UT) tablet Take 1,000 Units by mouth.    [provider]  hydrochlorothiazide (HYDRODIURIL) 25 MG tablet Take 25 mg by mouth daily.    [provider]  KLOR-CON M20 20 MEQ  tablet Take 20 mEq by mouth daily.    [provider]  potassium chloride (KLOR-CON) 20 MEQ packet Take by mouth 2 (two) times daily.    [provider]    Family History Family History  Problem Relation Age of Onset   Heart attack Mother    Cancer Mother    Breast cancer Neg Hx     Social History Social History   Tobacco Use   Smoking status: Never   Smokeless tobacco: Never  Vaping Use   Vaping status: Never Used  Substance Use Topics   Alcohol use: No   Drug use: Not Currently     Allergies   Patient has no known allergies.   Review of Systems Review of Systems: negative unless otherwise stated in HPI.      Physical Exam Triage Vital Signs ED Triage Vitals  Encounter Vitals Group     BP 10/26/23 1039 (!) 149/87     Systolic BP Percentile --      Diastolic BP Percentile --      Pulse Rate 10/26/23 1039 89     Resp 10/26/23 1039 18     Temp 10/26/23 1039 98.3 F (36.8 C)     Temp Source 10/26/23 1039 Oral     SpO2 10/26/23  1039 100 %     Weight 10/26/23 1040 135 lb (61.2 kg)     Height 10/26/23 1040 5\' 1"  (1.549 m)     Head Circumference --      Peak Flow --      Pain Score 10/26/23 1040 0     Pain Loc --      Pain Education --      Exclude from Growth Chart --    No data found.  Updated Vital Signs BP (!) 149/87 (BP Location: Left Arm) Comment: did not take bp meds this morning.  Pulse 89   Temp 98.3 F (36.8 C) (Oral)   Resp 18   Ht 5\' 1"  (1.549 m)   Wt 61.2 kg   SpO2 100%   BMI 25.51 kg/m   Visual Acuity Right Eye Distance: 20/25 (corrected) Left Eye Distance: 20/25 (corrected) Bilateral Distance: 20/25 (corrected)  Right Eye Near:   Left Eye Near:    Bilateral Near:     Physical Exam HENT:     Head:      Comments: Pain was experienced in this location.    GEN:     alert, well-appearing elderly female and no distress    HENT:  mucus membranes moist, oropharyngeal without lesions or erythema,  nares patent,  no nasal discharge  EYES:   pupils equal and reactive, EOM intact NECK:  supple, good ROM RESP:  clear to auscultation bilaterally, no increased work of breathing  CVS:   regular rate and rhythm NEURO:  alert, oriented x4, speech normal, CN 2-12 grossly intact, no facial droop,  sensation grossly intact, strength 5/5 bilateral UE and LE, normal coordination, normal finger to nose, normal gait Skin:   warm and dry    UC Treatments / Results  Labs (all labs ordered are listed, but only abnormal results are displayed) Labs Reviewed - No data to display  EKG  If EKG performed, see my interpretation in the MDM section  Radiology No results found.   Procedures Procedures (including critical care time) No results found.    Visual Acuity  Right Eye Distance: 20/25 (corrected) Left Eye Distance: 20/25 (corrected) Bilateral Distance: 20/25 (corrected)  Right Eye Near:   Left Eye Near:    Bilateral Near:      Medications Ordered in UC Medications - No data to display  Initial Impression / Assessment and Plan / UC Course  I have reviewed the triage vital signs and the nursing notes.  Pertinent labs & imaging results that were available during my care of the patient were reviewed by me and considered in my medical decision making (see chart for details).       Patient is a 83 y.o. female  who has history of hypertension, hyperal cholesterolemia, presents for a single episode of sharp left-sided facial pain after waking up this morning.  Overall patient is well-appearing and afebrile.  Vital signs stable.   On chart review patient had a TIA back in September 2016.  She was observed and had a workup which included an MRI and MRA of the head and neck that showed no acute stroke or significant stenosis.  Her exam today is reassuring.  Vision screen unremarkable.  Cardiopulmonary neurological exams are unremarkable.  Assurance provided.  This could be nerve irritation versus a  vascular issue.   Explained to patient I cannot rule out that she had a TIA or is going to have a stroke soon.  If her symptoms return she  was instructed to go to the hospital for head imaging.  Understanding voiced.  Female family member present for conversation.   ED and return precautions given and patient/guardian voiced understanding. Discussed MDM, treatment plan and plan for follow-up with patient who agrees with plan.    Final Clinical Impressions(s) / UC Diagnoses   Final diagnoses:  Facial pain  History of transient ischemic attack (TIA)     Discharge Instructions      As discussed, the cause of your facial pain has not been identified. Possibly vascular issue or nerve sensitivity.  I worry if your symptoms return you could eventually have a stroke. Go to the emergency department at the hospital for head imaging and rule out possible stroke, if symptoms return. Otherwise, follow up with your primary care provider.      Go to ED for red flag symptoms, including; severe headaches, vision changes, numbness/weakness in part of the body, lethargy, confusion, intractable vomiting, severe dehydration, chest pain, breathing difficulty,  signs of severe infection (increased redness, swelling of an area), feeling faint or passing out, dizziness, etc. You should especially go to the ED for sudden acute worsening of condition if you do not elect to go at this time.      ED Prescriptions   None    PDMP not reviewed this encounter.       Shakeel Disney, DO 10/26/23 1133

## 2024-02-27 ENCOUNTER — Other Ambulatory Visit: Payer: Self-pay | Admitting: Gerontology

## 2024-02-27 DIAGNOSIS — Z1231 Encounter for screening mammogram for malignant neoplasm of breast: Secondary | ICD-10-CM

## 2024-03-31 ENCOUNTER — Ambulatory Visit

## 2024-08-13 ENCOUNTER — Ambulatory Visit

## 2024-09-15 ENCOUNTER — Ambulatory Visit
